# Patient Record
Sex: Female | Born: 1985 | Race: White | Hispanic: No | Marital: Married | Smoking: Never smoker
Health system: Southern US, Community
[De-identification: ages and names within clinical notes are randomized; demographics above are authoritative.]

## PROBLEM LIST (undated history)

## (undated) DIAGNOSIS — E039 Hypothyroidism, unspecified: Secondary | ICD-10-CM

## (undated) DIAGNOSIS — K8020 Calculus of gallbladder without cholecystitis without obstruction: Secondary | ICD-10-CM

## (undated) DIAGNOSIS — E282 Polycystic ovarian syndrome: Secondary | ICD-10-CM

## (undated) HISTORY — PX: WISDOM TOOTH EXTRACTION: SHX21

---
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
## 2010-03-03 ENCOUNTER — Encounter: Payer: Self-pay | Admitting: Endocrinology

## 2013-08-26 ENCOUNTER — Ambulatory Visit (HOSPITAL_COMMUNITY)
Admission: RE | Admit: 2013-08-26 | Discharge: 2013-08-26 | Disposition: A | Discharge status: Home / Self Care | Source: Ambulatory Visit | Attending: Obstetrics & Gynecology | Admitting: Obstetrics & Gynecology

## 2013-08-26 ENCOUNTER — Ambulatory Visit (HOSPITAL_COMMUNITY): Admission: RE | Admit: 2013-08-26 | Source: Ambulatory Visit

## 2013-08-26 NOTE — Lactation Note (Signed)
Lactation Consult  Gail George 593 month old  Mother's reason for visit:  Post revision of frenum Visit Type:  Outpatient Appointment Notes:  Mom and Gail George are here from [place] related to family needs.  They did not deliver here. Gail George is 4 days post revision.  Mom reports pain at the breast is much less and that Gail George is finally BF better.  SHe has only been latching her once a day related to pain but has been exclusively BF since Monday. Assisted with deeper latch and showed mom how to support her breast so that Gail George was able to keep it in her mouth.  She transferred 68 ml total. Mom said that she had just fed and hour before consult. Plan is for mom to continue working with her. I encouraged Mom to BF her on cue and to use the techniques we discussed. She will start attending support group and do weight checks. Consult:  Initial Lactation Consultant:  Soyla DryerJoseph, Alphonsus Doyel  ________________________________________________________________________    ________________________________________________________________________  Mother's Name: Gail George Type of delivery:   Breastfeeding Experience:  Has been latching once a day since birth and bottle feeding the other feedings Maternal Medical Conditions:   Hypo thyroid PCOS Maternal Medications:    ________________________________________________________________________  Breastfeeding History (Post Discharge)  Frequency of breastfeeding: Every 3 hours Duration of feeding:  20 minutes     Infant Intake and Output Assessment  Voids:  5-6 in 24 hrs.  Color:  Clear yellow Stools:  1-2 in 24 hrs.  Color:  Yellow  ________________________________________________________________________  Maternal Breast Assessment  Breast:  Soft and Compressible Nipple:  Erect Pain level:  0 Pain interventions:  NA  _______________________________________________________________________ Feeding Assessment/Evaluation  Initial feeding  assessment:  Infant's oral assessment:  Healing from revision. Tissue is WNL  Positioning:  Cross cradle and football(baby has been seen for torticollis) Right breast  LATCH documentation:  Latch:  1 = Repeated attempts needed to sustain latch, nipple held in mouth throughout feeding, stimulation needed to elicit sucking reflex.  Audible swallowing:  2 = Spontaneous and intermittent  Type of nipple:  2 = Everted at rest and after stimulation  Comfort (Breast/Nipple):  2 = Soft / non-tender  Hold (Positioning):  1 = Assistance needed to correctly position infant at breast and maintain latch  LATCH score:  8  Attached assessment:  Deep  Lips flanged:  Yes.    Lips untucked:  No.  Suck assessment:  Nutritive    Pre-feed weight:  5808 g   Post-feed weight:  5866 Amount transferred:  58 ml plus 10 from left br Amount supplemented:  0 ml

## 2013-09-19 NOTE — Addendum Note (Signed)
Encounter addended by: Soyla DryerMaryann Jarrett Chicoine, RN on: 09/19/2013  8:57 PM<BR>     Documentation filed: Clinical Notes

## 2013-09-19 NOTE — Lactation Note (Signed)
Lactation Consult  Gail George is concerned because her baby Gail George ( 4 months) has gained 3 ounces in 4 weeks.  Tonight she transferred 65 ml in support group.  Mom reports that she eats every 2-3 hours but can sleep for 7 hours overnight.  She also only eats on one breast.  I advised mom to feed her from both breasts at each feeding and to wake her every 4 hours during the night.  After feeding her on the second breast and then back to the first her total transfer was 5 ounces!.  Mom plans to come to support group tomorrow for a weight check.  I also asked her to connect with a pediatrician while she is in town for the next 2-3 months.  She will contact Dr,. Donnie CoffinRubin who sees mom's sibling.

## 2015-11-11 DIAGNOSIS — K8020 Calculus of gallbladder without cholecystitis without obstruction: Secondary | ICD-10-CM

## 2015-11-11 HISTORY — DX: Calculus of gallbladder without cholecystitis without obstruction: K80.20

## 2015-11-18 ENCOUNTER — Encounter (HOSPITAL_BASED_OUTPATIENT_CLINIC_OR_DEPARTMENT_OTHER): Payer: Self-pay | Admitting: Emergency Medicine

## 2015-11-18 ENCOUNTER — Emergency Department (HOSPITAL_BASED_OUTPATIENT_CLINIC_OR_DEPARTMENT_OTHER)

## 2015-11-18 ENCOUNTER — Emergency Department (HOSPITAL_BASED_OUTPATIENT_CLINIC_OR_DEPARTMENT_OTHER)
Admission: EM | Admit: 2015-11-18 | Discharge: 2015-11-18 | Disposition: A | Discharge status: Home / Self Care | Source: Home / Self Care | Attending: Emergency Medicine | Admitting: Emergency Medicine

## 2015-11-18 DIAGNOSIS — Z803 Family history of malignant neoplasm of breast: Secondary | ICD-10-CM | POA: Diagnosis not present

## 2015-11-18 DIAGNOSIS — K8050 Calculus of bile duct without cholangitis or cholecystitis without obstruction: Secondary | ICD-10-CM | POA: Insufficient documentation

## 2015-11-18 DIAGNOSIS — R1011 Right upper quadrant pain: Secondary | ICD-10-CM

## 2015-11-18 DIAGNOSIS — Z7984 Long term (current) use of oral hypoglycemic drugs: Secondary | ICD-10-CM | POA: Insufficient documentation

## 2015-11-18 DIAGNOSIS — K8051 Calculus of bile duct without cholangitis or cholecystitis with obstruction: Secondary | ICD-10-CM | POA: Diagnosis not present

## 2015-11-18 DIAGNOSIS — Z79899 Other long term (current) drug therapy: Secondary | ICD-10-CM | POA: Insufficient documentation

## 2015-11-18 DIAGNOSIS — Z800 Family history of malignant neoplasm of digestive organs: Secondary | ICD-10-CM | POA: Diagnosis not present

## 2015-11-18 DIAGNOSIS — E282 Polycystic ovarian syndrome: Secondary | ICD-10-CM | POA: Diagnosis not present

## 2015-11-18 DIAGNOSIS — R630 Anorexia: Secondary | ICD-10-CM | POA: Diagnosis not present

## 2015-11-18 DIAGNOSIS — E039 Hypothyroidism, unspecified: Secondary | ICD-10-CM

## 2015-11-18 DIAGNOSIS — K851 Biliary acute pancreatitis without necrosis or infection: Secondary | ICD-10-CM | POA: Diagnosis not present

## 2015-11-18 DIAGNOSIS — O9963 Diseases of the digestive system complicating the puerperium: Secondary | ICD-10-CM

## 2015-11-18 DIAGNOSIS — N390 Urinary tract infection, site not specified: Secondary | ICD-10-CM | POA: Diagnosis not present

## 2015-11-18 DIAGNOSIS — K429 Umbilical hernia without obstruction or gangrene: Secondary | ICD-10-CM | POA: Diagnosis not present

## 2015-11-18 DIAGNOSIS — K8070 Calculus of gallbladder and bile duct without cholecystitis without obstruction: Secondary | ICD-10-CM

## 2015-11-18 HISTORY — DX: Polycystic ovarian syndrome: E28.2

## 2015-11-18 HISTORY — DX: Hypothyroidism, unspecified: E03.9

## 2015-11-18 LAB — CBC WITH DIFFERENTIAL/PLATELET
Basophils Absolute: 0 10*3/uL (ref 0.0–0.1)
Basophils Relative: 0 %
EOS ABS: 0.1 10*3/uL (ref 0.0–0.7)
EOS PCT: 1 %
HCT: 38.8 % (ref 36.0–46.0)
Hemoglobin: 13 g/dL (ref 12.0–15.0)
LYMPHS ABS: 2 10*3/uL (ref 0.7–4.0)
Lymphocytes Relative: 14 %
MCH: 29.9 pg (ref 26.0–34.0)
MCHC: 33.5 g/dL (ref 30.0–36.0)
MCV: 89.2 fL (ref 78.0–100.0)
MONO ABS: 1 10*3/uL (ref 0.1–1.0)
Monocytes Relative: 7 %
Neutro Abs: 11.5 10*3/uL — ABNORMAL HIGH (ref 1.7–7.7)
Neutrophils Relative %: 78 %
PLATELETS: 269 10*3/uL (ref 150–400)
RBC: 4.35 MIL/uL (ref 3.87–5.11)
RDW: 11.9 % (ref 11.5–15.5)
WBC: 14.6 10*3/uL — AB (ref 4.0–10.5)

## 2015-11-18 LAB — LIPASE, BLOOD: LIPASE: 22 U/L (ref 11–51)

## 2015-11-18 LAB — COMPREHENSIVE METABOLIC PANEL
ALT: 153 U/L — AB (ref 14–54)
ANION GAP: 8 (ref 5–15)
AST: 190 U/L — ABNORMAL HIGH (ref 15–41)
Albumin: 4.2 g/dL (ref 3.5–5.0)
Alkaline Phosphatase: 117 U/L (ref 38–126)
BUN: 15 mg/dL (ref 6–20)
CHLORIDE: 104 mmol/L (ref 101–111)
CO2: 25 mmol/L (ref 22–32)
Calcium: 9.5 mg/dL (ref 8.9–10.3)
Creatinine, Ser: 0.58 mg/dL (ref 0.44–1.00)
GFR calc non Af Amer: >60 mL/min (ref >60)
Glucose, Bld: 93 mg/dL (ref 65–99)
POTASSIUM: 4.1 mmol/L (ref 3.5–5.1)
SODIUM: 137 mmol/L (ref 135–145)
Total Bilirubin: 1.6 mg/dL — ABNORMAL HIGH (ref 0.3–1.2)
Total Protein: 7.2 g/dL (ref 6.5–8.1)

## 2015-11-18 LAB — URINALYSIS, ROUTINE W REFLEX MICROSCOPIC
Bilirubin Urine: NEGATIVE
Glucose, UA: NEGATIVE mg/dL
Hgb urine dipstick: NEGATIVE
KETONES UR: NEGATIVE mg/dL
NITRITE: NEGATIVE
PH: 8.5 — AB (ref 5.0–8.0)
Protein, ur: 30 mg/dL — AB
SPECIFIC GRAVITY, URINE: 1.027 (ref 1.005–1.030)

## 2015-11-18 LAB — URINE MICROSCOPIC-ADD ON: RBC / HPF: NONE SEEN RBC/hpf (ref 0–5)

## 2015-11-18 MED ORDER — ONDANSETRON HCL 4 MG PO TABS: 4.0000 mg | ORAL_TABLET | Freq: Four times a day (QID) | ORAL | 0 refills | Status: DC

## 2015-11-18 MED ORDER — ONDANSETRON 4 MG PO TBDP
4.0000 mg | ORAL_TABLET | Freq: Once | ORAL | Status: AC
  Administered 2015-11-18: 4 mg via ORAL
  Filled 2015-11-18: qty 1

## 2015-11-18 MED ORDER — ONDANSETRON 4 MG PO TBDP
ORAL_TABLET | ORAL | Status: AC
  Filled 2015-11-18: qty 1

## 2015-11-18 MED ORDER — HYDROCODONE-ACETAMINOPHEN 5-325 MG PO TABS
1.0000 | ORAL_TABLET | Freq: Once | ORAL | Status: AC
  Administered 2015-11-18: 1 via ORAL
  Filled 2015-11-18: qty 1

## 2015-11-18 MED ORDER — HYDROCODONE-ACETAMINOPHEN 5-325 MG PO TABS: 1.0000 | ORAL_TABLET | Freq: Four times a day (QID) | ORAL | 0 refills | Status: DC | PRN

## 2015-11-18 MED ORDER — ONDANSETRON 4 MG PO TBDP
4.0000 mg | ORAL_TABLET | Freq: Once | ORAL | Status: AC | PRN
  Administered 2015-11-18: 4 mg via ORAL

## 2015-11-18 NOTE — ED Triage Notes (Signed)
Patient c/o RUQ abd pain that returned @ 1 week ago and is worsening. Patient reports she had a baby 6 weeks ago and had the pains at the same time then, was told it was her gallbladder. Patient reports 1 episode of emesis in past 24 hours. Patient took Motrin at home, but vomited after, states she did feel the pain easing prior to her emesis.

## 2015-11-18 NOTE — ED Provider Notes (Signed)
[facility] Provider Note   CSN: [id number] Arrival date & time: 11/18/15  1257  By signing my name below, I, Teofilo Pod, attest that this documentation has been prepared under the direction and in the presence of Gwyneth Sprout, MD . Electronically Signed: Teofilo Pod, ED Scribe. 11/18/2015. 3:20 PM.    History   Chief Complaint Chief Complaint  Patient presents with  . Abdominal Pain    RUQ   The history is provided by the patient. No language interpreter was used.   HPI Comments:  Gail George is a 30 y.o. female who presents to the Emergency Department complaining of intermittent right-sided abdominal pain x1 week. Pt states that her pain was 10/10 in severity this morning but has since improved. Pt is 6 weeks post partum. Pt states that she had an [place] after giving birth and was diagnosed with gall stones, and attributes this pain to her gall bladder. Pt complains of associated vomiting.  Pt states that the pain severity changes depending on what she eats. Pt took motrin at home with no relief, and vomited after taking the motrin. Pt denies fever, diarrhea.   Past Medical History:  Diagnosis Date  . Hypothyroidism   . PCOS (polycystic ovarian syndrome)     There are no active problems to display for this patient.   Past Surgical History:  Procedure Laterality Date  . WISDOM TOOTH EXTRACTION      OB History    No data available       Home Medications    Prior to Admission medications   Medication Sig Start Date End Date Taking? Authorizing Provider  metFORMIN (GLUMETZA) 500 MG (MOD) 24 hr tablet Take 500 mg by mouth daily with breakfast.   Yes Historical Provider, MD  thyroid (ARMOUR) 130 MG tablet Take 75 mg by mouth daily.   Yes Historical Provider, MD    Family History History reviewed. No pertinent family history.  Social History Social History  Substance Use Topics  . Smoking status: Never Smoker  . Smokeless tobacco:  Never Used  . Alcohol use No     Allergies   Review of patient's allergies indicates no known allergies.   Review of Systems Review of Systems   Physical Exam Updated Vital Signs BP 109/71 (BP Location: Left Arm)   Pulse 77   Temp 98.4 F (36.9 C) (Oral)   Resp 20   Wt 200 lb (90.7 kg)   LMP 12/25/2014 (Exact Date) Comment: Breastfeeding  SpO2 99%   Physical Exam  Constitutional: She appears well-developed and well-nourished. No distress.  HENT:  Head: Normocephalic and atraumatic.  Eyes: Conjunctivae are normal.  Cardiovascular: Normal rate.   Pulmonary/Chest: Effort normal.  Abdominal: She exhibits no distension.  Mild RUQ tenderness; no Murphy's sign  Neurological: She is alert.  Skin: Skin is warm and dry.  Psychiatric: She has a normal mood and affect.  Nursing note and vitals reviewed.    ED Treatments / Results  DIAGNOSTIC STUDIES:  Oxygen Saturation is 99% on RA, normal by my interpretation.    COORDINATION OF CARE:  3:20 PM Discussed treatment plan with pt at bedside and pt agreed to plan.   Labs (all labs ordered are listed, but only abnormal results are displayed) Labs Reviewed  URINALYSIS, ROUTINE W REFLEX MICROSCOPIC (NOT AT [facility]) - Abnormal; Notable for the following:       Result Value   Color, Urine AMBER (*)    pH 8.5 (*)  Protein, ur 30 (*)    Leukocytes, UA SMALL (*)    All other components within normal limits  URINE MICROSCOPIC-ADD ON - Abnormal; Notable for the following:    Squamous Epithelial / LPF 0-5 (*)    Bacteria, UA MANY (*)    All other components within normal limits  CBC WITH DIFFERENTIAL/PLATELET - Abnormal; Notable for the following:    WBC 14.6 (*)    Neutro Abs 11.5 (*)    All other components within normal limits  COMPREHENSIVE METABOLIC PANEL - Abnormal; Notable for the following:    AST 190 (*)    ALT 153 (*)    Total Bilirubin 1.6 (*)    All other components within normal limits  LIPASE, BLOOD     EKG  EKG Interpretation None       Radiology Koreas Abdomen Limited Ruq  Result Date: 11/18/2015 CLINICAL DATA:  Right upper quadrant pain for 1 week. EXAM: US ABDOMEN LIMITED - RIGHT UPPER QUADRANT COMPARISON:  None. FINDINGS: Gallbladder: An 8 mm non shadowing hyperechoic region is seen in the gallbladder consistent with non shadowing stones versus sludge. No wall thickening or pericholecystic fluid. No Murphy's sign. Common bile duct: Diameter: 10 mm. Shadowing in the region of the distal common bile duct measuring up to 7 mm, worrisome for a distal common bile duct stone. Liver: Intrahepatic ductal dilatation.  No focal mass. IMPRESSION: 1. Intra and extrahepatic ductal dilatation with a 7 mm hyperechoic focus in the distal common bile duct. This constellation of findings is highly concerning for choledocholithiasis. An MRCP could better evaluate. 2. Small stones or sludge in the gallbladder. No wall thickening, pericholecystic fluid, or Murphy's sign. Electronically Signed   By: Gerome Samavid  Williams III M.D   On: 11/18/2015 16:05    Procedures Procedures (including critical care time)  Medications Ordered in ED Medications  ondansetron (ZOFRAN-ODT) disintegrating tablet 4 mg (4 mg Oral Given 11/18/15 1327)     Initial Impression / Assessment and Plan / ED Course  I have reviewed the triage vital signs and the nursing notes.  Pertinent labs & imaging results that were available during my care of the patient were reviewed by me and considered in my medical decision making (see chart for details).  Clinical Course   Patient is a 30 year old female with known gallbladder disease presenting today with right upper quadrant pain which was not improving. When I went to evaluate the patient she states her pain is now resolved. She did have emesis with this. She has had waxing and waning symptoms over the last 1 week but states today was the worst. She is 6 weeks postpartum and found out she had  gallstones at [redacted] weeks pregnant. She has recently relocated to this area needs follow-up with the surgeon. On exam she has some mild soreness in her right upper quadrant but no rebound or guarding. Ultrasound today shows dilation of the common bile duct and a stone present in the common bile duct consistent with choledocholithiasis. Labs pending.  5:30 PM LFTs are elevated today, elevated T bili and normal lipase. We'll discuss with general surgery.  11:18 PM Discussed with Dr. Alvester MorinNewton and at this time pt's pain is significantly improved and not displaying sx of cholecystitis or sepsis.  He recommended f/u with surgery and GI this week for cholecystectomy but does not need emergent surgery at this time.  Pt given referrals and pain control.  Also discussed diet.  Given strict return precautions for worsening pain,  emesis or fever.  Final Clinical Impressions(s) / ED Diagnoses   Final diagnoses:  RUQ pain  Cholelithiasis with choledocholithiasis    New Prescriptions Discharge Medication List as of 11/18/2015  6:56 PM    START taking these medications   Details  HYDROcodone-acetaminophen (NORCO/VICODIN) 5-325 MG tablet Take 1-2 tablets by mouth every 6 (six) hours as needed., Starting Sun 11/18/2015, Print    ondansetron (ZOFRAN) 4 MG tablet Take 1 tablet (4 mg total) by mouth every 6 (six) hours., Starting Sun 11/18/2015, Print      I personally performed the services described in this documentation, which was scribed in my presence.  The recorded information has been reviewed and considered.     Gwyneth Sprout, MD 11/18/15 2320

## 2015-11-18 NOTE — ED Notes (Signed)
Pt in US

## 2015-11-18 NOTE — ED Notes (Signed)
Delay of care: Lab orders acknowledged by another RN, but not obtained.

## 2015-11-20 ENCOUNTER — Inpatient Hospital Stay (HOSPITAL_COMMUNITY)

## 2015-11-20 ENCOUNTER — Inpatient Hospital Stay (HOSPITAL_COMMUNITY)
Admission: EM | Admit: 2015-11-20 | Discharge: 2015-11-25 | DRG: 418 | Disposition: A | Discharge status: Home / Self Care | Attending: Internal Medicine | Admitting: Internal Medicine

## 2015-11-20 ENCOUNTER — Encounter (HOSPITAL_COMMUNITY): Payer: Self-pay

## 2015-11-20 DIAGNOSIS — E039 Hypothyroidism, unspecified: Secondary | ICD-10-CM | POA: Diagnosis present

## 2015-11-20 DIAGNOSIS — K429 Umbilical hernia without obstruction or gangrene: Secondary | ICD-10-CM | POA: Diagnosis present

## 2015-11-20 DIAGNOSIS — Z7984 Long term (current) use of oral hypoglycemic drugs: Secondary | ICD-10-CM

## 2015-11-20 DIAGNOSIS — K8051 Calculus of bile duct without cholangitis or cholecystitis with obstruction: Secondary | ICD-10-CM | POA: Diagnosis present

## 2015-11-20 DIAGNOSIS — K8309 Other cholangitis: Secondary | ICD-10-CM | POA: Diagnosis present

## 2015-11-20 DIAGNOSIS — Z800 Family history of malignant neoplasm of digestive organs: Secondary | ICD-10-CM | POA: Diagnosis not present

## 2015-11-20 DIAGNOSIS — N390 Urinary tract infection, site not specified: Secondary | ICD-10-CM | POA: Diagnosis not present

## 2015-11-20 DIAGNOSIS — B9689 Other specified bacterial agents as the cause of diseases classified elsewhere: Secondary | ICD-10-CM | POA: Diagnosis not present

## 2015-11-20 DIAGNOSIS — R739 Hyperglycemia, unspecified: Secondary | ICD-10-CM | POA: Diagnosis present

## 2015-11-20 DIAGNOSIS — K851 Biliary acute pancreatitis without necrosis or infection: Secondary | ICD-10-CM | POA: Diagnosis present

## 2015-11-20 DIAGNOSIS — K859 Acute pancreatitis without necrosis or infection, unspecified: Secondary | ICD-10-CM

## 2015-11-20 DIAGNOSIS — Z419 Encounter for procedure for purposes other than remedying health state, unspecified: Secondary | ICD-10-CM

## 2015-11-20 DIAGNOSIS — R630 Anorexia: Secondary | ICD-10-CM | POA: Diagnosis present

## 2015-11-20 DIAGNOSIS — R1011 Right upper quadrant pain: Secondary | ICD-10-CM | POA: Diagnosis present

## 2015-11-20 DIAGNOSIS — K8031 Calculus of bile duct with cholangitis, unspecified, with obstruction: Secondary | ICD-10-CM | POA: Diagnosis not present

## 2015-11-20 DIAGNOSIS — K8033 Calculus of bile duct with acute cholangitis with obstruction: Secondary | ICD-10-CM | POA: Diagnosis not present

## 2015-11-20 DIAGNOSIS — K838 Other specified diseases of biliary tract: Secondary | ICD-10-CM

## 2015-11-20 DIAGNOSIS — K8050 Calculus of bile duct without cholangitis or cholecystitis without obstruction: Secondary | ICD-10-CM

## 2015-11-20 DIAGNOSIS — Z803 Family history of malignant neoplasm of breast: Secondary | ICD-10-CM | POA: Diagnosis not present

## 2015-11-20 DIAGNOSIS — K831 Obstruction of bile duct: Secondary | ICD-10-CM

## 2015-11-20 DIAGNOSIS — R740 Nonspecific elevation of levels of transaminase and lactic acid dehydrogenase [LDH]: Secondary | ICD-10-CM

## 2015-11-20 DIAGNOSIS — R7401 Elevation of levels of liver transaminase levels: Secondary | ICD-10-CM | POA: Diagnosis present

## 2015-11-20 DIAGNOSIS — E282 Polycystic ovarian syndrome: Secondary | ICD-10-CM

## 2015-11-20 HISTORY — DX: Calculus of gallbladder without cholecystitis without obstruction: K80.20

## 2015-11-20 LAB — CBC WITH DIFFERENTIAL/PLATELET
BASOS ABS: 0 10*3/uL (ref 0.0–0.1)
BASOS PCT: 0 %
EOS PCT: 1 %
Eosinophils Absolute: 0.2 10*3/uL (ref 0.0–0.7)
HEMATOCRIT: 44.2 % (ref 36.0–46.0)
Hemoglobin: 14.4 g/dL (ref 12.0–15.0)
LYMPHS PCT: 8 %
Lymphs Abs: 1.9 10*3/uL (ref 0.7–4.0)
MCH: 29.5 pg (ref 26.0–34.0)
MCHC: 32.6 g/dL (ref 30.0–36.0)
MCV: 90.6 fL (ref 78.0–100.0)
MONO ABS: 1.2 10*3/uL — AB (ref 0.1–1.0)
Monocytes Relative: 5 %
NEUTROS ABS: 18.9 10*3/uL — AB (ref 1.7–7.7)
Neutrophils Relative %: 86 %
PLATELETS: 262 10*3/uL (ref 150–400)
RBC: 4.88 MIL/uL (ref 3.87–5.11)
RDW: 12.2 % (ref 11.5–15.5)
WBC: 22.2 10*3/uL — AB (ref 4.0–10.5)

## 2015-11-20 LAB — COMPREHENSIVE METABOLIC PANEL
ALK PHOS: 246 U/L — AB (ref 38–126)
ALT: 650 U/L — ABNORMAL HIGH (ref 14–54)
ANION GAP: 10 (ref 5–15)
AST: 352 U/L — ABNORMAL HIGH (ref 15–41)
Albumin: 3.9 g/dL (ref 3.5–5.0)
BILIRUBIN TOTAL: 5.8 mg/dL — AB (ref 0.3–1.2)
BUN: 8 mg/dL (ref 6–20)
CALCIUM: 9.5 mg/dL (ref 8.9–10.3)
CO2: 21 mmol/L — ABNORMAL LOW (ref 22–32)
Chloride: 108 mmol/L (ref 101–111)
Creatinine, Ser: 0.74 mg/dL (ref 0.44–1.00)
GFR calc Af Amer: >60 mL/min (ref >60)
GLUCOSE: 133 mg/dL — AB (ref 65–99)
POTASSIUM: 4.1 mmol/L (ref 3.5–5.1)
Sodium: 139 mmol/L (ref 135–145)
TOTAL PROTEIN: 7.1 g/dL (ref 6.5–8.1)

## 2015-11-20 LAB — URINALYSIS, ROUTINE W REFLEX MICROSCOPIC
GLUCOSE, UA: NEGATIVE mg/dL
KETONES UR: 15 mg/dL — AB
Nitrite: NEGATIVE
PROTEIN: NEGATIVE mg/dL
Specific Gravity, Urine: 1.011 (ref 1.005–1.030)
pH: 6 (ref 5.0–8.0)

## 2015-11-20 LAB — LIPID PANEL
CHOL/HDL RATIO: 1.8 ratio
Cholesterol: 188 mg/dL (ref 0–200)
HDL: 105 mg/dL (ref >40)
LDL Cholesterol: 76 mg/dL (ref 0–99)
Triglycerides: 36 mg/dL (ref <150)
VLDL: 7 mg/dL (ref 0–40)

## 2015-11-20 LAB — URINE MICROSCOPIC-ADD ON

## 2015-11-20 LAB — PROTEIN / CREATININE RATIO, URINE
Creatinine, Urine: 46.07 mg/dL
Protein Creatinine Ratio: 0.22 mg/mg{Cre} — ABNORMAL HIGH (ref 0.00–0.15)
Total Protein, Urine: 10 mg/dL

## 2015-11-20 LAB — LIPASE, BLOOD: Lipase: 3802 U/L — ABNORMAL HIGH (ref 11–51)

## 2015-11-20 LAB — I-STAT CG4 LACTIC ACID, ED: LACTIC ACID, VENOUS: 0.7 mmol/L (ref 0.5–1.9)

## 2015-11-20 MED ORDER — SODIUM CHLORIDE 0.9 % IV BOLUS (SEPSIS)
1000.0000 mL | Freq: Once | INTRAVENOUS | Status: AC
  Administered 2015-11-20: 1000 mL via INTRAVENOUS

## 2015-11-20 MED ORDER — HYDROMORPHONE HCL 1 MG/ML IJ SOLN
1.0000 mg | INTRAMUSCULAR | Status: AC
  Administered 2015-11-20: 1 mg via INTRAVENOUS
  Filled 2015-11-20: qty 1

## 2015-11-20 MED ORDER — HYDROMORPHONE HCL 1 MG/ML IJ SOLN
1.0000 mg | INTRAMUSCULAR | Status: DC | PRN
  Administered 2015-11-20: 1 mg via INTRAVENOUS
  Filled 2015-11-20: qty 1

## 2015-11-20 MED ORDER — DEXTROSE IN LACTATED RINGERS 5 % IV SOLN
INTRAVENOUS | Status: DC
  Administered 2015-11-20: 200 mL/h via INTRAVENOUS
  Administered 2015-11-21 – 2015-11-25 (×4): via INTRAVENOUS

## 2015-11-20 MED ORDER — SODIUM CHLORIDE 0.9 % IV SOLN: INTRAVENOUS | Status: DC

## 2015-11-20 MED ORDER — ONDANSETRON HCL 4 MG/2ML IJ SOLN
4.0000 mg | Freq: Four times a day (QID) | INTRAMUSCULAR | Status: DC | PRN
  Administered 2015-11-21 – 2015-11-23 (×3): 4 mg via INTRAVENOUS
  Filled 2015-11-20 (×3): qty 2

## 2015-11-20 MED ORDER — PIPERACILLIN-TAZOBACTAM 3.375 G IVPB 30 MIN
3.3750 g | Freq: Once | INTRAVENOUS | Status: AC
  Administered 2015-11-20: 3.375 g via INTRAVENOUS
  Filled 2015-11-20: qty 50

## 2015-11-20 MED ORDER — ONDANSETRON HCL 4 MG/2ML IJ SOLN
4.0000 mg | Freq: Once | INTRAMUSCULAR | Status: AC
  Administered 2015-11-20: 4 mg via INTRAVENOUS
  Filled 2015-11-20: qty 2

## 2015-11-20 MED ORDER — HYDROMORPHONE HCL 1 MG/ML IJ SOLN
0.5000 mg | Freq: Once | INTRAMUSCULAR | Status: AC
  Administered 2015-11-20: 0.5 mg via INTRAVENOUS
  Filled 2015-11-20: qty 1

## 2015-11-20 MED ORDER — PIPERACILLIN-TAZOBACTAM 3.375 G IVPB
3.3750 g | Freq: Three times a day (TID) | INTRAVENOUS | Status: DC
  Administered 2015-11-20 – 2015-11-23 (×8): 3.375 g via INTRAVENOUS
  Filled 2015-11-20 (×10): qty 50

## 2015-11-20 MED ORDER — HYDROMORPHONE HCL 1 MG/ML IJ SOLN
0.5000 mg | Freq: Once | INTRAMUSCULAR | Status: AC
Start: 2015-11-20 — End: 2015-11-20
  Administered 2015-11-20: 0.5 mg via INTRAVENOUS
  Filled 2015-11-20: qty 1

## 2015-11-20 MED ORDER — HYDROMORPHONE HCL 1 MG/ML IJ SOLN
1.0000 mg | INTRAMUSCULAR | Status: DC | PRN
  Administered 2015-11-20 – 2015-11-24 (×19): 1 mg via INTRAVENOUS
  Filled 2015-11-20 (×19): qty 1

## 2015-11-20 MED ORDER — FAMOTIDINE 10 MG PO TABS
10.0000 mg | ORAL_TABLET | Freq: Once | ORAL | Status: AC
  Administered 2015-11-20: 10 mg via ORAL
  Filled 2015-11-20: qty 1

## 2015-11-20 MED ORDER — LACTATED RINGERS IV SOLN
INTRAVENOUS | Status: DC
  Administered 2015-11-20: 200 mL/h via INTRAVENOUS

## 2015-11-20 MED ORDER — PROMETHAZINE HCL 25 MG/ML IJ SOLN
12.5000 mg | Freq: Four times a day (QID) | INTRAMUSCULAR | Status: DC | PRN
  Administered 2015-11-20 – 2015-11-23 (×7): 12.5 mg via INTRAVENOUS
  Filled 2015-11-20 (×8): qty 1

## 2015-11-20 MED ORDER — MORPHINE SULFATE (PF) 4 MG/ML IV SOLN
4.0000 mg | Freq: Once | INTRAVENOUS | Status: AC
  Administered 2015-11-20: 4 mg via INTRAVENOUS
  Filled 2015-11-20: qty 1

## 2015-11-20 MED ORDER — HYDROMORPHONE HCL 1 MG/ML IJ SOLN
INTRAMUSCULAR | Status: AC
  Filled 2015-11-20: qty 1

## 2015-11-20 NOTE — ED Notes (Signed)
Pt here for R sided abd pain with pain10/10. She has a history of gallstones and is expected to have gallbladder removed soon. Pt. Is also 6 weeks post partum. She came in today with nausea, vomiting, and extreme pain. She took a prescribed vicodin this Am before EMS, and received Zofran in the truck. She has had 4 zofran, 4 morphine, and 1 of dilaudid total with us down here. Pt. Has received 1000ml bolus and first dose of IV Zosyn down here. Admitting MD still wants the blood cultures even though she has had her first dose of zosyn. Patient has a 20g in her L forearm, almost AC and husband at bedside.

## 2015-11-20 NOTE — Care Management Note (Signed)
Case Management Note  Patient Details  Name: Gail George MRN: [id number] Date of Birth: 1985-10-04  Subjective/Objective:    Pt in with Cholelithiasis with obstruction. She is from home with her spouse.                Action/Plan: Plan is to return home when patient medially stable. CM following for d/c needs.   Expected Discharge Date:                  Expected Discharge Plan:  Home/Self Care  In-House Referral:     Discharge planning Services     Post Acute Care Choice:    Choice offered to:     DME Arranged:    DME Agency:     HH Arranged:    HH Agency:     Status of Service:  In process, will continue to follow  If discussed at Long Length of Stay Meetings, dates discussed:    Additional Comments:  Kermit BaloKelli F Renny Remer, RN 11/20/2015, 3:04 PM

## 2015-11-20 NOTE — ED Provider Notes (Signed)
[facility] DEPT Provider Note   CSN: [id number] Arrival date & time: 11/20/15  9381     History   Chief Complaint Chief Complaint  Patient presents with  . Abdominal Pain    HPI Gail George is a 30 y.o. female.  HPI   Patient's very pleasant 30 year old female in acute distress on arrival. She reports that she had right upper quadrant pain that started getting worse this morning. Last night she had feelings of chills and fever with some nausea. This morning she started with intense right upper quadrant pain. She's been having right upper quadrant pain throughout pregnancy. (she is 6 weeks post partum)  She was seen at [facility] a couple days ago for such and found to have dilated ducts with no evidence of stone, thought to be choledocholithiasis. At that time his consult with Dr. Ernestina Patches because she was comfortable she returned home for outpatient scheduling of surgery.  Past Medical History:  Diagnosis Date  . Hypothyroidism   . PCOS (polycystic ovarian syndrome)     Patient Active Problem List   Diagnosis Date Noted  . Choledocholithiasis with obstruction 11/20/2015  . Hypothyroidism 11/20/2015  . PCOS (polycystic ovarian syndrome) 11/20/2015  . Pancreatitis due to biliary obstruction 11/20/2015  . Hyperglycemia 11/20/2015  . Transaminitis 11/20/2015  . Cholangitis 11/20/2015  . Hyperbilirubinemia 11/20/2015    Past Surgical History:  Procedure Laterality Date  . WISDOM TOOTH EXTRACTION      OB History    No data available       Home Medications    Prior to Admission medications   Medication Sig Start Date End Date Taking? Authorizing Provider  HYDROcodone-acetaminophen (NORCO/VICODIN) 5-325 MG tablet Take 1-2 tablets by mouth every 6 (six) hours as needed. 11/18/15  Yes Blanchie Dessert, MD  metFORMIN (GLUMETZA) 500 MG (MOD) 24 hr tablet Take 500 mg by mouth every evening.    Yes Historical Provider, MD  ondansetron (ZOFRAN) 4 MG tablet Take 1  tablet (4 mg total) by mouth every 6 (six) hours. 11/18/15  Yes Blanchie Dessert, MD  Prenatal Vit-Fe Fumarate-FA (PRENATAL MULTIVITAMIN) TABS tablet Take 1 tablet by mouth daily at 12 noon.   Yes Historical Provider, MD  thyroid (ARMOUR) 15 MG tablet Take 15 mg by mouth daily. Pt takes with 60 mg to equal 75 mg   Yes Historical Provider, MD  thyroid (ARMOUR) 60 MG tablet Take 60 mg by mouth daily before breakfast. Pt takes with 15 mg to equal 75 mg   Yes Historical Provider, MD  thyroid (ARMOUR) 130 MG tablet Take 75 mg by mouth daily.    Historical Provider, MD    Family History No family history on file.  Social History Social History  Substance Use Topics  . Smoking status: Never Smoker  . Smokeless tobacco: Never Used  . Alcohol use No     Allergies   Review of patient's allergies indicates no known allergies.   Review of Systems Review of Systems  Constitutional: Positive for fatigue.  HENT: Negative for congestion.   Gastrointestinal: Positive for abdominal pain, nausea and vomiting.  Psychiatric/Behavioral: Negative for agitation.     Physical Exam Updated Vital Signs BP 120/81 (BP Location: Right Arm)   Pulse 64   Temp 98.4 F (36.9 C) (Oral)   Resp 16   LMP 12/25/2014 (Exact Date) Comment: Breastfeeding  SpO2 96%   Physical Exam  Constitutional: She is oriented to person, place, and time. She appears well-developed and well-nourished.  HENT:  Head: Normocephalic and atraumatic.  Mildly jaundiced.  Eyes: Right eye exhibits no discharge.  Cardiovascular:  Tachycardia.  Pulmonary/Chest: Effort normal.  Abdominal: There is tenderness.  Rebound and guarding extreme tenderness to the right upper quadrant.  Neurological: She is oriented to person, place, and time.  Skin: Skin is warm and dry. She is not diaphoretic.  Psychiatric: She has a normal mood and affect.  Nursing note and vitals reviewed.    ED Treatments / Results  Labs (all labs ordered are  listed, but only abnormal results are displayed) Labs Reviewed  COMPREHENSIVE METABOLIC PANEL - Abnormal; Notable for the following:       Result Value   CO2 21 (*)    Glucose, Bld 133 (*)    AST 352 (*)    ALT 650 (*)    Alkaline Phosphatase 246 (*)    Total Bilirubin 5.8 (*)    All other components within normal limits  CBC WITH DIFFERENTIAL/PLATELET - Abnormal; Notable for the following:    WBC 22.2 (*)    Neutro Abs 18.9 (*)    Monocytes Absolute 1.2 (*)    All other components within normal limits  LIPASE, BLOOD - Abnormal; Notable for the following:    Lipase 3,802 (*)    All other components within normal limits  URINALYSIS, ROUTINE W REFLEX MICROSCOPIC (NOT AT [facility]) - Abnormal; Notable for the following:    Color, Urine AMBER (*)    APPearance CLOUDY (*)    Hgb urine dipstick LARGE (*)    Bilirubin Urine SMALL (*)    Ketones, ur 15 (*)    Leukocytes, UA MODERATE (*)    All other components within normal limits  PROTEIN / CREATININE RATIO, URINE - Abnormal; Notable for the following:    Protein Creatinine Ratio 0.22 (*)    All other components within normal limits  URINE MICROSCOPIC-ADD ON - Abnormal; Notable for the following:    Squamous Epithelial / LPF 6-30 (*)    Bacteria, UA FEW (*)    All other components within normal limits  CULTURE, BLOOD (ROUTINE X 2)  CULTURE, BLOOD (ROUTINE X 2)  URINE CULTURE  LIPID PANEL  I-STAT CG4 LACTIC ACID, ED    EKG  EKG Interpretation  Date/Time:  Tuesday November 20 2015 09:35:02 EDT Ventricular Rate:  70 PR Interval:    QRS Duration: 76 QT Interval:  401 QTC Calculation: 433 R Axis:   68 Text Interpretation:  Age not entered, assumed to be  30 years old for purpose of ECG interpretation Sinus rhythm Normal sinus rhythm Confirmed by Gerald Leitz ([id number]) on 11/20/2015 10:07:35 AM       Radiology US Abdomen Limited  Result Date: 11/20/2015 CLINICAL DATA:  Choledocholithiasis.  Six weeks postpartum. EXAM: US  ABDOMEN LIMITED - RIGHT UPPER QUADRANT COMPARISON:  11/18/2015 FINDINGS: Gallbladder: Mild to moderate distention of the gallbladder without gallbladder without wall thickening. Reportedly, the patient does not have a sonographic Murphy's sign. There is at least 1 small echogenic structure in the gallbladder measuring 0.5 cm. Common bile duct: Diameter: Measures up to 1.0 cm and stable. A biliary stone is not confidently identified but the distal common bile duct is difficult to evaluate. Liver: No focal lesion identified. Normal echogenicity in the liver. There is mild to moderate intrahepatic biliary dilatation. IMPRESSION: Intrahepatic and extrahepatic biliary dilatation. Common bile duct is stable in size from the previous examination, measuring up to 1.0 cm. Stone is not confidently identified on this examination but  limited evaluation of the distal common bile duct. Findings remain concerning for a distal common bile duct stone. This could be better characterized with MRCP. Mild to moderate distention of the gallbladder without wall thickening. There is at least one small gallstone. Electronically Signed   By: Markus Daft M.D.   On: 11/20/2015 16:09    Procedures Procedures (including critical care time)  Medications Ordered in ED Medications  piperacillin-tazobactam (ZOSYN) IVPB 3.375 g (not administered)  ondansetron (ZOFRAN) injection 4 mg (not administered)  HYDROmorphone (DILAUDID) injection 1 mg (not administered)  lactated ringers infusion (200 mL/hr Intravenous New Bag/Given 11/20/15 1416)  morphine 4 MG/ML injection 4 mg (4 mg Intravenous Given 11/20/15 0926)  ondansetron (ZOFRAN) injection 4 mg (4 mg Intravenous Given 11/20/15 0926)  sodium chloride 0.9 % bolus 1,000 mL (0 mLs Intravenous Stopped 11/20/15 1050)  HYDROmorphone (DILAUDID) injection 0.5 mg (0.5 mg Intravenous Given 11/20/15 1027)  piperacillin-tazobactam (ZOSYN) IVPB 3.375 g (0 g Intravenous Stopped 11/20/15 1120)    HYDROmorphone (DILAUDID) injection 0.5 mg (0.5 mg Intravenous Given 11/20/15 1147)  sodium chloride 0.9 % bolus 1,000 mL (1,000 mLs Intravenous Given 11/20/15 1306)  HYDROmorphone (DILAUDID) injection 1 mg (1 mg Intravenous Given 11/20/15 1359)     Initial Impression / Assessment and Plan / ED Course  I have reviewed the triage vital signs and the nursing notes.  Pertinent labs & imaging results that were available during my care of the patient were reviewed by me and considered in my medical decision making (see chart for details).  Clinical Course  Comment By Time  Still awaiting labs  Mccayla Shimada Lyn Jacobo Moncrief, MD 10/10 63   Patient is a very pleasant 30 year old female with known choledocholithiasis who had worsening pain and jaundice. Patient presents today in extreme pain in her right upper quadrant. She's had nausea vomiting diarrhea. I think this is likely worsening of her choledocholithiasis- concern for ascending cholangitis. We'll treat pain and nausea. We will get labs and immediate consultation surgery.  Have considered help syndrome in this patient given that she is postpartum. However we are awaiting urine for protein but her blood pressure has not met criteria.  4:17 PM Talked to GI.  They will come see her. Talked to surgery as well, they will follow. Ultimately she needs ERCP. Zosyn and fluids given.  Will admit to medicine.   Final Clinical Impressions(s) / ED Diagnoses   Final diagnoses:  Choledocholithiasis  Pancreatitis due to biliary obstruction    New Prescriptions Current Discharge Medication List       Cathren Sween Julio Alm, MD 11/20/15 1617

## 2015-11-20 NOTE — Progress Notes (Signed)
recvd phone call from pt's mother who works at [employer] alerting me that her daughter was in ED with GB problems In reviewing medical record, it appears pt has CBD stone given u/s finding and now elevated LFTs. With reported fevers and RUQ this also concerning for possible cholangitis.   Needs IV fluid Needs IV abx - zosyn GI consult for ERCP Will see for cholecystectomy but duct needs to be addressed first.  Discussed with EDP Consult to follow  Mary SellaEric M. Andrey CampanileWilson, MD, FACS General, Bariatric, & Minimally Invasive Surgery [facility] [place]

## 2015-11-20 NOTE — Consult Note (Signed)
[facility] Consult: 12:00 PM 11/20/2015  LOS: 0 days    Referring Provider: Dr Nelson Chimes in ED.   Primary Care Physician:  No PCP Per Patient Primary Gastroenterologist:  Dr Myrtie Cruise.          GI Attending   I have taken an interval history, reviewed the chart and examined the patient. I agree with the Advanced Practitioner's note, impression and recommendations. See bold for my notes.  Iva Boop, MD, [facility] [phone] (pager) [phone] after 5 PM, weekends and holidays  11/20/2015 5:15 PM      IMPRESSION:   *  Probable choledocholithiasis. Question cholangitis?  Gallbladder stones and sludge.  Had issues with gallstones during her pregnancy.  *  Elevated lipase. Likely biliary pancreatitis.  *  Status post vaginal delivery on 10/06/15.  US shows 10 mm CBD still - distal duct not seen well. Clinical scenario suggests she has CBD stone and acute pancreatitis.  PLAN:     *  Case D/w Dr. Leone Payor.  We will repeat the ultrasound to reassess the common bile duct. It may allow some cueing of the pancreas as well. Keep her nothing by mouth and continue antibiotics along with analgesics and antibiotics.    *  Bolus of normal saline has been ordered by Dr. Earlene Plater. After this is completed we will begin lactated Ringer's at 200 ML's per hour.   D5LR fluids (glucose added as NPO) ERCP tomorrow The risks and benefits as well as alternatives of endoscopic procedure(s) have been discussed and reviewed. All questions answered. The patient agrees to proceed. Make Dilaudid q 2 and add promethazine     Jennye Moccasin  11/20/2015, 12:00 PM Pager: [phone]        Reason for Consultation:  Abdominal pain and    HPI: Gail George is a 30 y.o. female.  Hx  polycystic ovary disease.  Hypothyroidism. 6 weeks PP, vaginal delivery of her 2nd child on 8/26.  This was at hospital in [facility]   Pt had symptomatic gallstones during pregnancy weeks 20 - 32.  Too far along for surgery.  Pain eventually subsided.  Husband says she did not require antibiotics.   She and her spouse are now living with his parents in [place].    ~ 7 to 10 days ago began having recurrent right abdominal pain and nausea, bilious emesis.  Went to [facility] ED. LFTs elevated. WBCs 14 K.  11/17/15 ultrasound: 10mm CBD.  7 mm hyperechoic focus in the distal common bile duct. Findings  "highly concerning for choledocholithiasis" " MRCP could better evaluate".  Small stones and sludge in GB.   sxs subsided during ED stay.  Case d/w Dr Marland KitchenAlvester Morin" (? Ezzard Standing) who rec'd surgical and GI follow up ([facility] and [facility] phone #s provided).  Discharged with Rx of Zofran and Vicodin.  Has not made apt with GI.   Sxs got way worse in last 24 hours, severe right abdominal pain.  Chills, subjective fever. Bilious n/v, dark amber urine.  Came back to ED.  LFTs further elevted along with new elevation of Lipase.  WBCs to 22 K.  No additional imaging.  Pain briefly relieved with Dilaudid. Dosed with Zosyn.    Ref. Range 11/18/2015 16:50 11/20/2015 09:29  Lipase Latest Ref Range: 11 - 51 U/L 22 3,802 (H)  AST Latest Ref Range: 15 - 41 U/L 190 (H) 352 (H)  ALT Latest Ref Range: 14 - 54 U/L 153 (H) 650 (H)  Total Protein Latest Ref Range: 6.5 - 8.1 g/dL 7.2 7.1  Total Bilirubin Latest Ref Range: 0.3 - 1.2 mg/dL 1.6 (H) 5.8 (H)   Past Medical History:  Diagnosis Date  . Hypothyroidism   . PCOS (polycystic ovarian syndrome)     Past Surgical History:  Procedure Laterality Date  . WISDOM TOOTH EXTRACTION      Prior to Admission medications   Medication Sig Start Date End Date Taking? Authorizing Provider  HYDROcodone-acetaminophen (NORCO/VICODIN) 5-325 MG tablet Take 1-2 tablets by mouth every 6 (six)  hours as needed. 11/18/15  Yes Gwyneth Sprout, MD  metFORMIN (GLUMETZA) 500 MG (MOD) 24 hr tablet Take 500 mg by mouth every evening.    Yes Historical Provider, MD  ondansetron (ZOFRAN) 4 MG tablet Take 1 tablet (4 mg total) by mouth every 6 (six) hours. 11/18/15  Yes Gwyneth Sprout, MD  Prenatal Vit-Fe Fumarate-FA (PRENATAL MULTIVITAMIN) TABS tablet Take 1 tablet by mouth daily at 12 noon.   Yes Historical Provider, MD  thyroid (ARMOUR) 15 MG tablet Take 15 mg by mouth daily. Pt takes with 60 mg to equal 75 mg   Yes Historical Provider, MD  thyroid (ARMOUR) 60 MG tablet Take 60 mg by mouth daily before breakfast. Pt takes with 15 mg to equal 75 mg   Yes Historical Provider, MD  thyroid (ARMOUR) 130 MG tablet Take 75 mg by mouth daily.    Historical Provider, MD    Scheduled Meds:   Infusions: . piperacillin-tazobactam (ZOSYN)  IV     PRN Meds:    Allergies as of 11/20/2015  . (No Known Allergies)    No family history on file.  Social History   Social History  . Marital status: Married    Spouse name: N/A  . Number of children: N/A  . Years of education: N/A   Occupational History  . Not on file.   Social History Main Topics  . Smoking status: Never Smoker  . Smokeless tobacco: Never Used  . Alcohol use No  . Drug use: No  . Sexual activity: Yes    Birth control/ protection: None   Other Topics Concern  . Not on file   Social History Narrative  . No narrative on file    REVIEW OF SYSTEMS: Constitutional:  Malaise, anorexia ENT:  No nose bleeds Pulm:  Pain in abdomen worse with deeper breathing.  Not dyspneic.  No cough CV:  No palpitations, no LE edema.  GU:  No hematuria, no frequency.  Although the urine is amber colored, she has been urinating well according to her husband. GI:  Per HPI Heme:  No unusual  Bleeding or bruising   Neuro:  No headaches, no peripheral tingling or numbness Derm:  No itching, no rash or sores.  Endocrine:  No sweats or  chills.  No polyuria or dysuria Immunization:  Not queried Travel:  None beyond local counties in last few months.    PHYSICAL EXAM: Vital signs in last 24 hours: Vitals:   11/20/15 1015 11/20/15 1030  BP: 129/90  132/68  Pulse: 70 75  Resp: 10 11   Wt Readings from Last 3 Encounters:  11/18/15 90.7 kg (200 lb)    General: WF who is in pain and uncomfortable Head:  No facial asymmetry or swelling.  Eyes:  Scleral icterus. Ears:  Not hard of hearing  Nose:  No discharge or congestion Mouth:  Moist, clear oral mucosa. Good dentition. Tongue is midline. Neck:  No JVD, no masses. No thyromegaly. Lungs:  Clear to auscultation and percussion bilaterally but diminished sounds overall due to poor inspiration. No increased work of breathing. Heart: RRR. No MRG. S1/S2 present. Abdomen:  Nondistended. Soft. Bowel sounds absent. Tenderness on the right side and epigastrium, worst in the right upper quadrant. No guarding or rebound..   Rectal: Deferred   Musc/Skeltl: No joint erythema or swelling. Extremities:  No CCE.  Neurologic:  She is alert. She is oriented 3. She moves all 4 limbs. No tremors. Skin:  No obvious jaundice. No sores or rashes.   Psych:  Somewhat anxious and agitated due to high pain level.  Intake/Output from previous day: No intake/output data recorded. Intake/Output this shift: No intake/output data recorded.  LAB RESULTS:  Recent Labs  11/18/15 1650 11/20/15 0929  WBC 14.6* 22.2*  HGB 13.0 14.4  HCT 38.8 44.2  PLT 269 262   BMET Lab Results  Component Value Date   NA 139 11/20/2015   NA 137 11/18/2015   K 4.1 11/20/2015   K 4.1 11/18/2015   CL 108 11/20/2015   CL 104 11/18/2015   CO2 21 (L) 11/20/2015   CO2 25 11/18/2015   GLUCOSE 133 (H) 11/20/2015   GLUCOSE 93 11/18/2015   BUN 8 11/20/2015   BUN 15 11/18/2015   CREATININE 0.74 11/20/2015   CREATININE 0.58 11/18/2015   CALCIUM 9.5 11/20/2015   CALCIUM 9.5 11/18/2015   LFT  Recent  Labs  11/18/15 1650 11/20/15 0929  PROT 7.2 7.1  ALBUMIN 4.2 3.9  AST 190* 352*  ALT 153* 650*  ALKPHOS 117 246*  BILITOT 1.6* 5.8*   PT/INR No results found for: INR, PROTIME Hepatitis Panel No results for input(s): HEPBSAG, HCVAB, HEPAIGM, HEPBIGM in the last 72 hours. C-Diff No components found for: CDIFF Lipase     Component Value Date/Time   LIPASE 3,802 (H) 11/20/2015 0929    RADIOLOGY STUDIES: [place]as Abdomen Limited Ruq  Result Date: 11/18/2015 CLINICAL DATA:  Right upper quadrant pain for 1 week. EXAM: US ABDOMEN LIMITED - RIGHT UPPER QUADRANT COMPARISON:  None. FINDINGS: Gallbladder: An 8 mm non shadowing hyperechoic region is seen in the gallbladder consistent with non shadowing stones versus sludge. No wall thickening or pericholecystic fluid. No Murphy's sign. Common bile duct: Diameter: 10 mm. Shadowing in the region of the distal common bile duct measuring up to 7 mm, worrisome for a distal common bile duct stone. Liver: Intrahepatic ductal dilatation.  No focal mass. IMPRESSION: 1. Intra and extrahepatic ductal dilatation with a 7 mm hyperechoic focus in the distal common bile duct. This constellation of findings is highly concerning for choledocholithiasis. An MRCP could better evaluate. 2. Small stones or sludge in the gallbladder. No wall thickening, pericholecystic fluid, or Murphy's sign. Electronically Signed   By: Gerome Samavid  Williams III M.D   On: 11/18/2015 16:05    ENDOSCOPIC STUDIES: None ever

## 2015-11-20 NOTE — Consult Note (Signed)
[facility] Consult Note  Gail George 1985-10-03  [id number].    Requesting MD: Dr. Thomasene Lot Chief Complaint/Reason for Consult: symptomatic cholelithiasis HPI:  30 y/o female 6 mos post-partum with a PMH of PCOS, gallstones, and hypothyroidism who presents to [facility] with severe RUQ pain. Patient previously seen at [facility] 10/8 where RUQ suggested choledocholithiasis with common bile duct dilatation and she was discharged with instructions for outpatient GI and surgical follow up. Today, her abdominal pain became more severe. Associated symptoms include subjective fever, chills, nausea, and bilious vomiting. Her last episode of vomiting was today. Her last meal was yesterday afternoon. She denies blood thinning medications or previous abdominal surgeries. General surgery was asked to consult regarding possible cholecystectomy.  WBC 22 Hepatic Function Latest Ref Rng & Units 11/20/2015 11/18/2015  Total Protein 6.5 - 8.1 g/dL 7.1 7.2  Albumin 3.5 - 5.0 g/dL 3.9 4.2  AST 15 - 41 U/L 352(H) 190(H)  ALT 14 - 54 U/L 650(H) 153(H)  Alk Phosphatase 38 - 126 U/L 246(H) 117  Total Bilirubin 0.3 - 1.2 mg/dL 5.8(H) 1.6(H)    ROS: All systems reviewed and otherwise negative except for as above  No family history on file.  Past Medical History:  Diagnosis Date  . Hypothyroidism   . PCOS (polycystic ovarian syndrome)     Past Surgical History:  Procedure Laterality Date  . WISDOM TOOTH EXTRACTION      Social History:  reports that she has never smoked. She has never used smokeless tobacco. She reports that she does not drink alcohol or use drugs.  Allergies: No Known Allergies  Medications Prior to Admission  Medication Sig Dispense Refill  . HYDROcodone-acetaminophen (NORCO/VICODIN) 5-325 MG tablet Take 1-2 tablets by mouth every 6 (six) hours as needed. 15 tablet 0  . metFORMIN (GLUMETZA) 500 MG (MOD) 24 hr tablet Take 500 mg by mouth every evening.     . ondansetron  (ZOFRAN) 4 MG tablet Take 1 tablet (4 mg total) by mouth every 6 (six) hours. 12 tablet 0  . Prenatal Vit-Fe Fumarate-FA (PRENATAL MULTIVITAMIN) TABS tablet Take 1 tablet by mouth daily at 12 noon.    . thyroid (ARMOUR) 15 MG tablet Take 15 mg by mouth daily. Pt takes with 60 mg to equal 75 mg    . thyroid (ARMOUR) 60 MG tablet Take 60 mg by mouth daily before breakfast. Pt takes with 15 mg to equal 75 mg    . thyroid (ARMOUR) 130 MG tablet Take 75 mg by mouth daily.      Blood pressure 128/85, pulse 76, temperature 98.4 F (36.9 C), temperature source Oral, resp. rate 16, last menstrual period 12/25/2014, SpO2 93 %. Physical Exam: General: ill-appearing, cooperative, overweight female laying in bed in obvious distress.  HEENT: head is normocephalic, atraumatic. Heart: regular, rate, and rhythm.  No obvious murmurs, gallops, or rubs noted.  Palpable pedal pulses bilaterally Lungs: CTAB, no wheezes, rhonchi, or rales noted.  Respiratory effort nonlabored Abd: soft, tender to light palpation of RUQ and RLQ without peritonitis, hypoactive bowel sounds MS: all 4 extremities are symmetrical with no cyanosis, clubbing, or edema. Skin: warm and dry. + jaundice Psych: appropriate affect. Neuro: A&Ox3, moves all extremities spontaneously, normal speech  Results for orders placed or performed during the hospital encounter of 11/20/15 (from the past 48 hour(s))  Comprehensive metabolic panel     Status: Abnormal   Collection Time: 11/20/15  9:29 AM  Result Value Ref Range   Sodium  139 135 - 145 mmol/L   Potassium 4.1 3.5 - 5.1 mmol/L   Chloride 108 101 - 111 mmol/L   CO2 21 (L) 22 - 32 mmol/L   Glucose, Bld 133 (H) 65 - 99 mg/dL   BUN 8 6 - 20 mg/dL   Creatinine, Ser 0.74 0.44 - 1.00 mg/dL   Calcium 9.5 8.9 - 10.3 mg/dL   Total Protein 7.1 6.5 - 8.1 g/dL   Albumin 3.9 3.5 - 5.0 g/dL   AST 352 (H) 15 - 41 U/L   ALT 650 (H) 14 - 54 U/L   Alkaline Phosphatase 246 (H) 38 - 126 U/L   Total  Bilirubin 5.8 (H) 0.3 - 1.2 mg/dL   GFR calc non Af Amer >60 >60 mL/min   GFR calc Af Amer >60 >60 mL/min    Comment: (NOTE) The eGFR has been calculated using the CKD EPI equation. This calculation has not been validated in all clinical situations. eGFR's persistently <60 mL/min signify possible Chronic Kidney Disease.    Anion gap 10 5 - 15  CBC with Differential/Platelet     Status: Abnormal   Collection Time: 11/20/15  9:29 AM  Result Value Ref Range   WBC 22.2 (H) 4.0 - 10.5 K/uL   RBC 4.88 3.87 - 5.11 MIL/uL   Hemoglobin 14.4 12.0 - 15.0 g/dL   HCT 44.2 36.0 - 46.0 %   MCV 90.6 78.0 - 100.0 fL   MCH 29.5 26.0 - 34.0 pg   MCHC 32.6 30.0 - 36.0 g/dL   RDW 12.2 11.5 - 15.5 %   Platelets 262 150 - 400 K/uL   Neutrophils Relative % 86 %   Neutro Abs 18.9 (H) 1.7 - 7.7 K/uL   Lymphocytes Relative 8 %   Lymphs Abs 1.9 0.7 - 4.0 K/uL   Monocytes Relative 5 %   Monocytes Absolute 1.2 (H) 0.1 - 1.0 K/uL   Eosinophils Relative 1 %   Eosinophils Absolute 0.2 0.0 - 0.7 K/uL   Basophils Relative 0 %   Basophils Absolute 0.0 0.0 - 0.1 K/uL  Lipase, blood     Status: Abnormal   Collection Time: 11/20/15  9:29 AM  Result Value Ref Range   Lipase 3,802 (H) 11 - 51 U/L    Comment: RESULTS CONFIRMED BY MANUAL DILUTION  I-Stat CG4 Lactic Acid, ED     Status: None   Collection Time: 11/20/15  9:35 AM  Result Value Ref Range   Lactic Acid, Venous 0.70 0.5 - 1.9 mmol/L  Urinalysis, Routine w reflex microscopic (not at [facility])     Status: Abnormal   Collection Time: 11/20/15 10:35 AM  Result Value Ref Range   Color, Urine AMBER (A) YELLOW    Comment: BIOCHEMICALS MAY BE AFFECTED BY COLOR   APPearance CLOUDY (A) CLEAR   Specific Gravity, Urine 1.011 1.005 - 1.030   pH 6.0 5.0 - 8.0   Glucose, UA NEGATIVE NEGATIVE mg/dL   Hgb urine dipstick LARGE (A) NEGATIVE   Bilirubin Urine SMALL (A) NEGATIVE   Ketones, ur 15 (A) NEGATIVE mg/dL   Protein, ur NEGATIVE NEGATIVE mg/dL   Nitrite  NEGATIVE NEGATIVE   Leukocytes, UA MODERATE (A) NEGATIVE  Protein / creatinine ratio, urine     Status: Abnormal   Collection Time: 11/20/15 10:35 AM  Result Value Ref Range   Creatinine, Urine 46.07 mg/dL   Total Protein, Urine 10 mg/dL    Comment: NO NORMAL RANGE ESTABLISHED FOR THIS TEST   Protein Creatinine  Ratio 0.22 (H) 0.00 - 0.15 mg/mg[Cre]  Urine microscopic-add on     Status: Abnormal   Collection Time: 11/20/15 10:35 AM  Result Value Ref Range   Squamous Epithelial / LPF 6-30 (A) NONE SEEN   WBC, UA 6-30 0 - 5 WBC/hpf   RBC / HPF 0-5 0 - 5 RBC/hpf   Bacteria, UA FEW (A) NONE SEEN   US Abdomen Limited Ruq  Result Date: 11/18/2015 CLINICAL DATA:  Right upper quadrant pain for 1 week. EXAM: US ABDOMEN LIMITED - RIGHT UPPER QUADRANT COMPARISON:  None. FINDINGS: Gallbladder: An 8 mm non shadowing hyperechoic region is seen in the gallbladder consistent with non shadowing stones versus sludge. No wall thickening or pericholecystic fluid. No Murphy's sign. Common bile duct: Diameter: 10 mm. Shadowing in the region of the distal common bile duct measuring up to 7 mm, worrisome for a distal common bile duct stone. Liver: Intrahepatic ductal dilatation.  No focal mass. IMPRESSION: 1. Intra and extrahepatic ductal dilatation with a 7 mm hyperechoic focus in the distal common bile duct. This constellation of findings is highly concerning for choledocholithiasis. An MRCP could better evaluate. 2. Small stones or sludge in the gallbladder. No wall thickening, pericholecystic fluid, or Murphy's sign. Electronically Signed   By: Dorise Bullion III M.D   On: 11/18/2015 16:05   Assessment/Plan Ascending cholangitis  Symptomatic cholelithiasis Gallstone pancreatitis - lipase 3,802  - AST 352, ALT 650, Alk Phos 246, t.bili 5.8 - RUQ U/S 10/8 - dilated common bile duct, possible stone  - Suspect choledocholithiasis: GI consulted, repeating RUQ U/S, possible ERCP - blood cultures pending - general  surgery will plan to perform laparoscopic cholecystectomy after anticipated ERCP w/ stone removal.   Jill Alexanders, [facility] 11/20/2015, 12:49 PM Pager: [phone] Consults: [phone] Mon-Fri 7:00 am-4:30 pm Sat-Sun 7:00 am-11:30 am

## 2015-11-20 NOTE — H&P (Signed)
   Date: 11/20/2015               Patient Name:  Gail George MRN: [id number]  DOB: 01/16/1986 Age / Sex: 30 y.o., female   PCP: No Pcp Per Patient         Medical Service: Internal Medicine Teaching Service         Attending Physician: Dr. Emily B Mullen, MD    First Contact: Dr. Amin Pager: [phone]  Second Contact: Dr. Wallace Pager: [phone]       After Hours (After 5p/  First Contact Pager: [phone]  weekends / holidays): Second Contact Pager: [phone]   Chief Complaint: RUQ abdominal pain with nausea and vomiting.  History of Present Illness: Gail George is a 30 y. o female,With past medical history of PCO S and hypothyroidism , 6 weeks postpartum,vaginal delivery of her 2nd child on 8/26. presented to the ED with complaint of worsening right upper quadrant pain accompanied with nausea and vomiting for 1 day. She states that, she was diagnosed with gallstones during her second pregnancy,between 22-32 wks. when she started getting upper right quadrant pain. She never had any intervention done at that point was she was too far along for surgery. Her pain eventually subsides around 32 weeks. She'll be started getting intermittent right upper quadrant abdominal pain with some nausea and vomiting, 7-10 days ago. She went to [place] ED on Saturday, October 7.US abdomen done shows 10mm CBD.7 mm hyperechoic focus in the distal common bile duct. Findings  "highly concerning for choledocholithiasis. Her symptoms improved, and she decided to go home with a follow-up with GI and surgery. Last night her pain  Restarted, she also complain of some fever and chills yesterday night, her pain was 10 over 10, mostly in right upper quadrant radiating to the back, accompanied with nausea and vomiting. Currently her pain was 6/10 after Dilantin. Husband states that he noticed some yellowish discoloration of her skin and eyes over the last 2 days. She do endorse some loss of appetite. She denies any  hematemesis, diarrhea, dysuria, any abnormal vaginal discharge or bleeding. She is nursing her baby.   Meds:  Current Meds  Medication Sig  . HYDROcodone-acetaminophen (NORCO/VICODIN) 5-325 MG tablet Take 1-2 tablets by mouth every 6 (six) hours as needed.  . metFORMIN (GLUMETZA) 500 MG (MOD) 24 hr tablet Take 500 mg by mouth every evening.   . ondansetron (ZOFRAN) 4 MG tablet Take 1 tablet (4 mg total) by mouth every 6 (six) hours.  . Prenatal Vit-Fe Fumarate-FA (PRENATAL MULTIVITAMIN) TABS tablet Take 1 tablet by mouth daily at 12 noon.  . thyroid (ARMOUR) 15 MG tablet Take 15 mg by mouth daily. Pt takes with 60 mg to equal 75 mg  . thyroid (ARMOUR) 60 MG tablet Take 60 mg by mouth daily before breakfast. Pt takes with 15 mg to equal 75 mg     Allergies: Allergies as of 11/20/2015  . (No Known Allergies)   Past Medical History:  Diagnosis Date  . Hypothyroidism   . PCOS (polycystic ovarian syndrome)     Family History: Colon cancer - grandfather diagnosed > age of 60.                           Breast cancer- mother at the age of 57  Social History: Never smoked, no history of alcohol use, denies any illicit drugs.  Review of Systems: A complete ROS was   negative except as per HPI.   Physical Exam: Blood pressure 132/68, pulse 75, resp. rate 11, last menstrual period 12/25/2014, SpO2 98 %.  Vitals:   11/20/15 1200 11/20/15 1246 11/20/15 1253 11/20/15 1300  BP: 125/87 128/85 120/81   Pulse: 75 76 64   Resp: _0 Temp:  98.4 F (36.9 C)    TempSrc:  Oral    SpO2: 92% 93% (!) 88% 96%   General: Vital signs reviewed.  Patient is well-developed and well-nourished,Seems uncomfortable due to pain,  and cooperative with exam.  Head: Normocephalic and atraumatic. Eyes: EOMI, conjunctivae normal, mild scleral icterus.   Cardiovascular: RRR, S1 normal, S2 normal, no murmurs, gallops, or rubs. Pulmonary/Chest: Clear to auscultation bilaterally, no wheezes, rales, or  rhonchi. Abdominal: Soft, tender right and left upper quadrant and epigastrium more on the right ,non-distended, BS hypoactive, Extremities: No lower extremity edema bilaterally,  pulses symmetric and intact bilaterally. No cyanosis or clubbing. Neurological: A&O x3, Strength is normal and symmetric bilaterally, cranial nerve II-XII are grossly intact, no focal motor deficit, sensory intact to light touch bilaterally.  Skin: Warm, dry and intact. No rashes or erythema. Psychiatric: Normal mood and affect. speech and behavior is normal. Cognition and memory are normal.   Labs. CMP Latest Ref Rng & Units 11/20/2015 11/18/2015  Glucose 65 - 99 mg/dL 133(H) 93  BUN 6 - 20 mg/dL 8 15  Creatinine 0.44 - 1.00 mg/dL 0.74 0.58  Sodium 135 - 145 mmol/L 139 137  Potassium 3.5 - 5.1 mmol/L 4.1 4.1  Chloride 101 - 111 mmol/L 108 104  CO2 22 - 32 mmol/L 21(L) 25  Calcium 8.9 - 10.3 mg/dL 9.5 9.5  Total Protein 6.5 - 8.1 g/dL 7.1 7.2  Total Bilirubin 0.3 - 1.2 mg/dL 5.8(H) 1.6(H)  Alkaline Phos 38 - 126 U/L 246(H) 117  AST 15 - 41 U/L 352(H) 190(H)  ALT 14 - 54 U/L 650(H) 153(H)   CBC    Component Value Date/Time   WBC 22.2 (H) 11/20/2015 0929   RBC 4.88 11/20/2015 0929   HGB 14.4 11/20/2015 0929   HCT 44.2 11/20/2015 0929   PLT 262 11/20/2015 0929   MCV 90.6 11/20/2015 0929   MCH 29.5 11/20/2015 0929   MCHC 32.6 11/20/2015 0929   RDW 12.2 11/20/2015 0929   LYMPHSABS 1.9 11/20/2015 0929   MONOABS 1.2 (H) 11/20/2015 0929   EOSABS 0.2 11/20/2015 0929   BASOSABS 0.0 11/20/2015 0929   Lipase. 3,802  Lactic acid.0.70  Urinalysis    Component Value Date/Time   COLORURINE AMBER (A) 11/20/2015 1035   APPEARANCEUR CLOUDY (A) 11/20/2015 1035   LABSPEC 1.011 11/20/2015 1035   PHURINE 6.0 11/20/2015 1035   GLUCOSEU NEGATIVE 11/20/2015 1035   HGBUR LARGE (A) 11/20/2015 1035   BILIRUBINUR SMALL (A) 11/20/2015 1035   KETONESUR 15 (A) 11/20/2015 1035   PROTEINUR NEGATIVE 11/20/2015 1035    NITRITE NEGATIVE 11/20/2015 1035   LEUKOCYTESUR MODERATE (A) 11/20/2015 1035   Urine microscopic-add on  Order: [id number]  Status:  Final result Visible to patient:  No (Not Released) Next appt:  None    Ref Range & Units 10:35 2d ago   Squamous Epithelial / LPF NONE SEEN 6-30   0-5     WBC, UA 0 - 5 WBC/hpf 6-30  0-5    RBC / HPF 0 - 5 RBC/hpf 0-5  NONE SEEN    Bacteria, UA NONE SEEN FEW   MANY    Resulting Agency  [facility]    Specimen Collected: 11/20/15 10:35          EKG: Normal sinus rhythm. No acute changes.   Assessment & Plan by Problem:   Mrs. Aragones is a 29 y. o female,With past medical history of PCO S and hypothyroidism , 6 weeks postpartum,vaginal delivery of her 2nd child on 8/26. presented to the ED with complaint of worsening right upper quadrant pain accompanied with nausea and vomiting for 1 day. Her differential include cholangitis, choledocholithiasis with obstruction and pancreatitis.  HELLP,Endometritis and UTI might be another possibility, less likely, as she to have a good prenatal and postnatal care. She denies any abnormal vaginal discharge or bleeding. She was GBS negative. Her platelets Are normal, no history of Pre eclampsia, or Eclampsia and no protein in her urine. Her urine does show moderate amount of leukocytes with a few bacteria in there, but her symptoms does not point to a UTI as a cause of her current illness.  Cholangitis. It is the most likely diagnosis as she met all the criteria, having some fever with chills, leukocytosis, jaundice, abnormal liver enzymes and biliary dilation. She needs ERCP. GI was consulted they wanted to have a repeat abdominal ultrasound. -Abdominal ultrasound. -IV Zosyn -Fluid resuscitation, either with IV normal saline or Ringer's lactate. -Dilaudid 2 mg every 4 hourly if needed for pain.  Choledocholithiasis with obstruction. Her right upper quadrant abdominal ultrasound done on October 8 shows  intra-and extrahepatic ductal diabetic dictation with this 7 mm hypoechoic focus in the distal common bile duct. Which was highly concerning for choledocholithiasis. -MRCP for small stones or sludge in the gallbladder and to remove obstruction. -Surgery was consulted for possible laparoscopic cholecystectomy, they want MRCP to be done first.  Acute pancreatitis. Her lipase was 3802. She has no history of alcohol abuse, no previous episode of pancreatitis.Most probably due to obstruction. -Lipid panel to rule out any hypertriglyceridemia. -IV fluid resuscitation. -Nothing by mouth.  Hypothyroidism. Continue his home dose of Armour 75 mg daily.  PCOS. She is on metformin 500 mg daily at home. We will hold her metformin.  DVT. SCD Code. Full Diet. NPO.  Dispo: Admit patient to Inpatient with expected length of stay greater than 2 midnights.  Signed: Sumayya Amin, MD 11/20/2015, 11:32 AM  Pager: [phone] 

## 2015-11-20 NOTE — ED Notes (Signed)
Breast pump ordered from materials management

## 2015-11-20 NOTE — ED Triage Notes (Signed)
Abdominal pain since Sunday and now N/V. Pt. Has vomited twice this AM and. Took 1 vicodin on her own this AM and received 4 of zofran in route. Pain is still 10/10. Meeting with surgeon to have gallbladder removed soon.

## 2015-11-20 NOTE — Progress Notes (Signed)
Pharmacy Antibiotic Note  Gail George is a 30 y.o. female admitted on 11/20/2015 with cholangitis.  Pharmacy has been consulted for zosyn dosing.  Plan: Zosyn 3.375g IV q8h (4 hour infusion).     No data recorded.   Recent Labs Lab 11/18/15 1650 11/20/15 0929 11/20/15 0935  WBC 14.6* 22.2*  --   CREATININE 0.58 0.74  --   LATICACIDVEN  --   --  0.70    CrCl cannot be calculated (Unknown ideal weight.).    No Known Allergies  Isaac BlissMichael Kalle Bernath, PharmD, BCPS, [facility] Clinical Pharmacist Pager [phone] 11/20/2015 10:40 AM

## 2015-11-21 ENCOUNTER — Encounter (HOSPITAL_COMMUNITY): Payer: Self-pay | Admitting: Anesthesiology

## 2015-11-21 ENCOUNTER — Inpatient Hospital Stay (HOSPITAL_COMMUNITY)

## 2015-11-21 ENCOUNTER — Encounter (HOSPITAL_COMMUNITY)
Admission: EM | Disposition: A | Discharge status: Home / Self Care | Payer: Self-pay | Source: Home / Self Care | Attending: Internal Medicine

## 2015-11-21 DIAGNOSIS — K838 Other specified diseases of biliary tract: Secondary | ICD-10-CM

## 2015-11-21 LAB — CBC
HCT: 45.4 % (ref 36.0–46.0)
Hemoglobin: 14.8 g/dL (ref 12.0–15.0)
MCH: 29.4 pg (ref 26.0–34.0)
MCHC: 32.6 g/dL (ref 30.0–36.0)
MCV: 90.1 fL (ref 78.0–100.0)
PLATELETS: 263 10*3/uL (ref 150–400)
RBC: 5.04 MIL/uL (ref 3.87–5.11)
RDW: 12.5 % (ref 11.5–15.5)
WBC: 17.5 10*3/uL — AB (ref 4.0–10.5)

## 2015-11-21 LAB — COMPREHENSIVE METABOLIC PANEL
ALK PHOS: 190 U/L — AB (ref 38–126)
ALT: 380 U/L — ABNORMAL HIGH (ref 14–54)
AST: 107 U/L — AB (ref 15–41)
Albumin: 3.2 g/dL — ABNORMAL LOW (ref 3.5–5.0)
Anion gap: 9 (ref 5–15)
BUN: 12 mg/dL (ref 6–20)
CALCIUM: 8.8 mg/dL — AB (ref 8.9–10.3)
CHLORIDE: 105 mmol/L (ref 101–111)
CO2: 24 mmol/L (ref 22–32)
CREATININE: 0.79 mg/dL (ref 0.44–1.00)
Glucose, Bld: 101 mg/dL — ABNORMAL HIGH (ref 65–99)
Potassium: 4 mmol/L (ref 3.5–5.1)
Sodium: 138 mmol/L (ref 135–145)
Total Bilirubin: 2.8 mg/dL — ABNORMAL HIGH (ref 0.3–1.2)
Total Protein: 6.1 g/dL — ABNORMAL LOW (ref 6.5–8.1)

## 2015-11-21 LAB — LIPASE, BLOOD: LIPASE: 658 U/L — AB (ref 11–51)

## 2015-11-21 LAB — AMYLASE: Amylase: 598 U/L — ABNORMAL HIGH (ref 28–100)

## 2015-11-21 LAB — URINE CULTURE

## 2015-11-21 SURGERY — CANCELLED PROCEDURE

## 2015-11-21 NOTE — Anesthesia Preprocedure Evaluation (Deleted)
Anesthesia Evaluation  Patient identified by MRN, date of birth, ID band Patient awake    Reviewed: Allergy & Precautions, H&P , NPO status , Patient's Chart, lab work & pertinent test results  History of Anesthesia Complications Negative for: history of anesthetic complications  Airway Mallampati: II  TM Distance: >3 FB Neck ROM: full    Dental no notable dental hx.    Pulmonary neg pulmonary ROS,    Pulmonary exam normal breath sounds clear to auscultation       Cardiovascular negative cardio ROS Normal cardiovascular exam Rhythm:regular Rate:Normal     Neuro/Psych negative neurological ROS     GI/Hepatic negative GI ROS, Neg liver ROS,   Endo/Other  Hypothyroidism   Renal/GU negative Renal ROS     Musculoskeletal   Abdominal   Peds  Hematology negative hematology ROS (+)   Anesthesia Other Findings PCOS  Reproductive/Obstetrics negative OB ROS                             Anesthesia Physical Anesthesia Plan  ASA: II  Anesthesia Plan: General   Post-op Pain Management:    Induction: Intravenous  Airway Management Planned: Oral ETT  Additional Equipment:   Intra-op Plan:   Post-operative Plan:   Informed Consent: I have reviewed the patients History and Physical, chart, labs and discussed the procedure including the risks, benefits and alternatives for the proposed anesthesia with the patient or authorized representative who has indicated his/her understanding and acceptance.   Dental Advisory Given  Plan Discussed with: Anesthesiologist, CRNA and Surgeon  Anesthesia Plan Comments:         Anesthesia Quick Evaluation

## 2015-11-21 NOTE — Progress Notes (Signed)
   Patient Name: Gail George Date of Encounter: 11/21/2015, 9:34 AM    Assessment and Plan  Acute biliary pancreatitis Dilated CBD and suspected CBD stone   Her Sxs, exam and LFT's and pancreatic #'s and WBC are down.  This suggests stone passage vs stone moved up bile duct and not impacted.  Plan change to  MRCP and will see if stone has passed - if not then will do an ERCP  Iva Booparl E. Vernis Eid, MD, [facility] [phone] (pager) [phone] after 5 PM, weekends and holidays  11/21/2015 9:37 AM   Iva Booparl E. Sohil Timko, MD, [facility]     Subjective  Intense epigastric pain is gone. Now morediffuse bloating and abdominal pain but not as severe. No flatus or stool.   Objective  BP 109/69 (BP Location: Right Arm)   Pulse (!) 103   Temp 98.4 F (36.9 C) (Oral)   Resp 20   Ht 5\' 9"  (1.753 m)   Wt 199 lb 15.7 oz (90.7 kg)   LMP 12/25/2014 (Exact Date) Comment: Breastfeeding  SpO2 97%   BMI 29.53 kg/m  NAD Abd is quiet and soft, mildly tender Sl scleral icterus  Lab Results  Component Value Date   ALT 380 (H) 11/21/2015   AST 107 (H) 11/21/2015   ALKPHOS 190 (H) 11/21/2015   BILITOT 2.8 (H) 11/21/2015   Lab Results  Component Value Date   LIPASE 658 (H) 11/21/2015   Lab Results  Component Value Date   AMYLASE 598 (H) 11/21/2015       [phone] (pager) [phone] after 5 PM, weekends and holidays  11/21/2015 9:34 AM

## 2015-11-21 NOTE — Progress Notes (Signed)
Subjective: She was feeling better this morning her pain has improved, still around 6/10 in intensity, she denies any more nausea or vomiting.   Objective:  Vital signs in last 24 hours: Vitals:   11/20/15 2121 11/21/15 0128 11/21/15 0548 11/21/15 0905  BP: 115/71 106/71 (!) 93/58 109/69  Pulse: 100 97 94 (!) 103  Resp: 20 20 20 20   Temp: 99.2 F (37.3 C) 99 F (37.2 C) 98.6 F (37 C) 98.4 F (36.9 C)  TempSrc: Oral Oral Oral Oral  SpO2: 99% 100% 98% 97%  Weight:      Height:       Gen. well-developed, well-nourished lady, in no acute distress. Eyes and skin. Mild scleral icterus, mild pallor skin, though much improved as compared to yesterday. Abdomen. Soft, mildly diffuse tenderness more in the right upper and left quadrant and epigastrium, much improved from yesterday. Bowel sounds present. Lungs. Clear bilaterally CV. Regular rate and rhythm Extremities. No edema, no cyanosis, pulses 2+ bilaterally.  Labs. CBC Latest Ref Rng & Units 11/21/2015 11/20/2015 11/18/2015  WBC 4.0 - 10.5 K/uL 17.5(H) 22.2(H) 14.6(H)  Hemoglobin 12.0 - 15.0 g/dL 16.1 09.6 04.5  Hematocrit 36.0 - 46.0 % 45.4 44.2 38.8  Platelets 150 - 400 K/uL 263 262 269   CMP Latest Ref Rng & Units 11/21/2015 11/20/2015 11/18/2015  Glucose 65 - 99 mg/dL 409(W) 119(J) 93  BUN 6 - 20 mg/dL 12 8 15   Creatinine 0.44 - 1.00 mg/dL 4.78 2.95 6.21  Sodium 135 - 145 mmol/L 138 139 137  Potassium 3.5 - 5.1 mmol/L 4.0 4.1 4.1  Chloride 101 - 111 mmol/L 105 108 104  CO2 22 - 32 mmol/L 24 21(L) 25  Calcium 8.9 - 10.3 mg/dL 3.0(Q) 9.5 9.5  Total Protein 6.5 - 8.1 g/dL 6.1(L) 7.1 7.2  Total Bilirubin 0.3 - 1.2 mg/dL 2.8(H) 5.8(H) 1.6(H)  Alkaline Phos 38 - 126 U/L 190(H) 246(H) 117  AST 15 - 41 U/L 107(H) 352(H) 190(H)  ALT 14 - 54 U/L 380(H) 650(H) 153(H)   Lipid Panel     Component Value Date/Time   CHOL 188 11/20/2015 1509   TRIG 36 11/20/2015 1509   HDL 105 11/20/2015 1509   CHOLHDL 1.8 11/20/2015 1509   VLDL 7 11/20/2015 1509   LDLCALC 76 11/20/2015 1509   Lipase. 658  <<< 3,802 Amylase. 598  US Abdomen. IMPRESSION: Intrahepatic and extrahepatic biliary dilatation. Common bile duct is stable in size from the previous examination, measuring up to 1.0 cm. Stone is not confidently identified on this examination but limited evaluation of the distal common bile duct. Findings remain concerning for a distal common bile duct stone. This could be better characterized with MRCP.   Assessment/Plan:  Gail George is a 30 y. o female,With past medical history of PCO S and hypothyroidism , 6 weeks postpartum,vaginal delivery of her 2nd child on 8/26. presented to the ED with complaint of worsening right upper quadrant pain accompanied with nausea and vomiting for 1 day.  Cholangitis. Her pain and leukocytosis has improved, she remained afebrile. Her liver enzymes are trending down. Her repeat abdominal ultrasound was positive for the stable findings as in her previous ultrasound. --Continue IV Zosyn -Continue Fluid resuscitation, either with IV normal saline or Ringer's lactate. -Continue Dilaudid 2 mg every 4 hourly if needed for pain.  Choledocholithiasis with obstruction. There is no change in her repeat abdominal ultrasound, as her condition has improved we trending liver enzymes.GI was thinking that the stone might  have passed .they recommend doing MRCP first and then ERCP if needed.  Acute pancreatitis. Her lipase is trending down , it was 658 this morning as compared to above 3000 yesterday.her triglycerides were normal . We will continue with fluid resuscitation.. For the results of her MRCP. Her calcium was 8.8 this morning, as compared to 9.5 yesterday. -Monitor her calcium level.   Dispo: Anticipated discharge in approximately 1-2 day(s).   Gail CourserSumayya Ryann Pauli, MD 11/21/2015, 10:27 AM Pager: [phone]

## 2015-11-21 NOTE — Progress Notes (Signed)
Day of Surgery  Subjective: Feels better today but now feels bloated. Some upper abd pain. No emesis.   Objective: Vital signs in last 24 hours: Temp:  [98.4 F (36.9 C)-100 F (37.8 C)] 99.7 F (37.6 C) (10/11 2118) Pulse Rate:  [94-110] 110 (10/11 2118) Resp:  [16-20] 16 (10/11 2118) BP: (93-109)/(58-71) 103/60 (10/11 2118) SpO2:  [96 %-100 %] 100 % (10/11 2118) Last BM Date: 11/19/15  Intake/Output from previous day: 10/10 0701 - 10/11 0700 In: 4496.7 [I.V.:3346.7; IV Piggyback:1150] Out: 1050 [Urine:1050] Intake/Output this shift: Total I/O In: 480 [I.V.:480] Out: -   Awake, alert, nontoxic Mild scleral icterus cta Reg Soft, some bloating, epigastric TTP, no rebound/guarding  Lab Results:   Recent Labs  11/20/15 0929 11/21/15 0433  WBC 22.2* 17.5*  HGB 14.4 14.8  HCT 44.2 45.4  PLT 262 263   BMET  Recent Labs  11/20/15 0929 11/21/15 0433  NA 139 138  K 4.1 4.0  CL 108 105  CO2 21* 24  GLUCOSE 133* 101*  BUN 8 12  CREATININE 0.74 0.79  CALCIUM 9.5 8.8*   Hepatic Function Latest Ref Rng & Units 11/21/2015 11/20/2015 11/18/2015  Total Protein 6.5 - 8.1 g/dL 6.1(L) 7.1 7.2  Albumin 3.5 - 5.0 g/dL 3.2(L) 3.9 4.2  AST 15 - 41 U/L 107(H) 352(H) 190(H)  ALT 14 - 54 U/L 380(H) 650(H) 153(H)  Alk Phosphatase 38 - 126 U/L 190(H) 246(H) 117  Total Bilirubin 0.3 - 1.2 mg/dL 2.8(H) 5.8(H) 1.6(H)   Lipase     Component Value Date/Time   LIPASE 658 (H) 11/21/2015 0433    PT/INR No results for input(s): LABPROT, INR in the last 72 hours. ABG No results for input(s): PHART, HCO3 in the last 72 hours.  Invalid input(s): PCO2, PO2  Studies/Results: US Abdomen Limited  Result Date: 11/20/2015 CLINICAL DATA:  Choledocholithiasis.  Six weeks postpartum. EXAM: US ABDOMEN LIMITED - RIGHT UPPER QUADRANT COMPARISON:  11/18/2015 FINDINGS: Gallbladder: Mild to moderate distention of the gallbladder without gallbladder without wall thickening. Reportedly, the  patient does not have a sonographic Murphy's sign. There is at least 1 small echogenic structure in the gallbladder measuring 0.5 cm. Common bile duct: Diameter: Measures up to 1.0 cm and stable. A biliary stone is not confidently identified but the distal common bile duct is difficult to evaluate. Liver: No focal lesion identified. Normal echogenicity in the liver. There is mild to moderate intrahepatic biliary dilatation. IMPRESSION: Intrahepatic and extrahepatic biliary dilatation. Common bile duct is stable in size from the previous examination, measuring up to 1.0 cm. Stone is not confidently identified on this examination but limited evaluation of the distal common bile duct. Findings remain concerning for a distal common bile duct stone. This could be better characterized with MRCP. Mild to moderate distention of the gallbladder without wall thickening. There is at least one small gallstone. Electronically Signed   By: Markus Daft M.D.   On: 11/20/2015 16:09    Anti-infectives: Anti-infectives    Start     Dose/Rate Route Frequency Ordered Stop   11/20/15 1800  piperacillin-tazobactam (ZOSYN) IVPB 3.375 g     3.375 g 12.5 mL/hr over 240 Minutes Intravenous Every 8 hours 11/20/15 1039     11/20/15 1045  piperacillin-tazobactam (ZOSYN) IVPB 3.375 g     3.375 g 100 mL/hr over 30 Minutes Intravenous  Once 11/20/15 1039 11/20/15 1120      Assessment/Plan: Cholelithiasis Elevated LFTs, possible cholangitis Pancreatitis  LFTs down some today, cbd  duct dilated on repeat u/s but no def stone. Wbc better. Looks clinically better. Believe the bloating/epigastric pain is from pancreatitis  Continue IVF  Cont IV abx for possible cholangitis Although LFTs trended down some, ideally needs to have duct elevated first. Defer to GI whether MRCP or ERCP Discuss with GI  Discussed with mother via phone  Leighton Ruff. Redmond Pulling, MD, FACS General, Bariatric, & Minimally Invasive Surgery [facility]  [facility], [place]   LOS: 1 day    Gayland Curry 11/21/2015

## 2015-11-22 LAB — URINALYSIS, ROUTINE W REFLEX MICROSCOPIC
Glucose, UA: NEGATIVE mg/dL
Ketones, ur: 15 mg/dL — AB
NITRITE: POSITIVE — AB
PH: 6 (ref 5.0–8.0)
Protein, ur: 100 mg/dL — AB
SPECIFIC GRAVITY, URINE: 1.044 — AB (ref 1.005–1.030)

## 2015-11-22 LAB — COMPREHENSIVE METABOLIC PANEL
ALBUMIN: 2.5 g/dL — AB (ref 3.5–5.0)
ALK PHOS: 101 U/L (ref 38–126)
ALT: 174 U/L — ABNORMAL HIGH (ref 14–54)
ALT: 146 U/L — ABNORMAL HIGH (ref 14–54)
ANION GAP: 6 (ref 5–15)
AST: 36 U/L (ref 15–41)
AST: 38 U/L (ref 15–41)
Albumin: 2.5 g/dL — ABNORMAL LOW (ref 3.5–5.0)
Alkaline Phosphatase: 118 U/L (ref 38–126)
Anion gap: 6 (ref 5–15)
BILIRUBIN TOTAL: 2.1 mg/dL — AB (ref 0.3–1.2)
BUN: 9 mg/dL (ref 6–20)
BUN: 9 mg/dL (ref 6–20)
CALCIUM: 8 mg/dL — AB (ref 8.9–10.3)
CHLORIDE: 106 mmol/L (ref 101–111)
CO2: 29 mmol/L (ref 22–32)
CO2: 26 mmol/L (ref 22–32)
CREATININE: 0.66 mg/dL (ref 0.44–1.00)
Calcium: 8.2 mg/dL — ABNORMAL LOW (ref 8.9–10.3)
Chloride: 106 mmol/L (ref 101–111)
Creatinine, Ser: 0.83 mg/dL (ref 0.44–1.00)
GFR calc Af Amer: >60 mL/min (ref >60)
GFR calc Af Amer: >60 mL/min (ref >60)
GFR calc non Af Amer: >60 mL/min (ref >60)
GLUCOSE: 89 mg/dL (ref 65–99)
Glucose, Bld: 103 mg/dL — ABNORMAL HIGH (ref 65–99)
POTASSIUM: 3.7 mmol/L (ref 3.5–5.1)
Potassium: 3.8 mmol/L (ref 3.5–5.1)
SODIUM: 141 mmol/L (ref 135–145)
Sodium: 138 mmol/L (ref 135–145)
TOTAL PROTEIN: 5.1 g/dL — AB (ref 6.5–8.1)
Total Bilirubin: 2 mg/dL — ABNORMAL HIGH (ref 0.3–1.2)
Total Protein: 5.1 g/dL — ABNORMAL LOW (ref 6.5–8.1)

## 2015-11-22 LAB — URINE MICROSCOPIC-ADD ON

## 2015-11-22 LAB — LIPASE, BLOOD: LIPASE: 199 U/L — AB (ref 11–51)

## 2015-11-22 LAB — CBC
HEMATOCRIT: 38.7 % (ref 36.0–46.0)
HEMOGLOBIN: 12.4 g/dL (ref 12.0–15.0)
MCH: 29.1 pg (ref 26.0–34.0)
MCHC: 32 g/dL (ref 30.0–36.0)
MCV: 90.8 fL (ref 78.0–100.0)
Platelets: 206 10*3/uL (ref 150–400)
RBC: 4.26 MIL/uL (ref 3.87–5.11)
RDW: 12.7 % (ref 11.5–15.5)
WBC: 17.5 10*3/uL — AB (ref 4.0–10.5)

## 2015-11-22 MED ORDER — ENOXAPARIN SODIUM 40 MG/0.4ML ~~LOC~~ SOLN
40.0000 mg | SUBCUTANEOUS | Status: DC
  Administered 2015-11-22 – 2015-11-23 (×2): 40 mg via SUBCUTANEOUS
  Filled 2015-11-22 (×2): qty 0.4

## 2015-11-22 MED ORDER — BISACODYL 10 MG RE SUPP
10.0000 mg | Freq: Once | RECTAL | Status: AC
  Administered 2015-11-22: 10 mg via RECTAL
  Filled 2015-11-22: qty 1

## 2015-11-22 MED ORDER — IBUPROFEN 200 MG PO TABS: 600.0000 mg | ORAL_TABLET | Freq: Four times a day (QID) | ORAL | Status: DC | PRN

## 2015-11-22 MED ORDER — ACETAMINOPHEN 325 MG PO TABS
650.0000 mg | ORAL_TABLET | Freq: Four times a day (QID) | ORAL | Status: DC | PRN
Start: 2015-11-22 — End: 2015-11-22
  Administered 2015-11-22: 650 mg via ORAL
  Filled 2015-11-22: qty 2

## 2015-11-22 MED ORDER — GADOBENATE DIMEGLUMINE 529 MG/ML IV SOLN
20.0000 mL | Freq: Once | INTRAVENOUS | Status: AC
  Administered 2015-11-21: 20 mL via INTRAVENOUS

## 2015-11-22 NOTE — Progress Notes (Signed)
Subjective: Patient was feeling better this morning. Still complains of mild nausea and abdominal pain. No bowel movements yet.  Objective:  Vital signs in last 24 hours: Vitals:   11/20/15 1345 11/22/15 0140 11/22/15 0521 11/22/15 0856  BP:  102/61 98/67 112/71  Pulse:  (!) 104 (!) 108 (!) 101  Resp:  16 16 16   Temp:  99.2 F (37.3 C) 99.2 F (37.3 C) (!) 100.4 F (38 C)  TempSrc:  Oral Oral Oral  SpO2:  100% 94% 95%  Weight: 199 lb 15.7 oz (90.7 kg)     Height: 5\' 9"  (1.753 m)      Gen. well-developed, well-nourished lady, in no acute distress. Eyes and skin. Mild scleral icterus, mild pallor skin, though much improved as compared to yesterday. Abdomen. Soft, mildly diffuse tenderness all over her abdomen , Bowel sounds present. Lungs. Clear bilaterally CV. Regular rate and rhythm Extremities. No edema, no cyanosis, pulses 2+ bilaterally.  Labs. CBC Latest Ref Rng & Units 11/22/2015 11/21/2015 11/20/2015  WBC 4.0 - 10.5 K/uL 17.5(H) 17.5(H) 22.2(H)  Hemoglobin 12.0 - 15.0 g/dL 16.1 09.6 04.5  Hematocrit 36.0 - 46.0 % 38.7 45.4 44.2  Platelets 150 - 400 K/uL 206 263 262   CMP Latest Ref Rng & Units 11/22/2015 11/21/2015 11/20/2015  Glucose 65 - 99 mg/dL 409(W) 119(J) 478(G)  BUN 6 - 20 mg/dL 9 12 8   Creatinine 0.44 - 1.00 mg/dL 9.56 2.13 0.86  Sodium 135 - 145 mmol/L 141 138 139  Potassium 3.5 - 5.1 mmol/L 3.7 4.0 4.1  Chloride 101 - 111 mmol/L 106 105 108  CO2 22 - 32 mmol/L 29 24 21(L)  Calcium 8.9 - 10.3 mg/dL 8.2(L) 8.8(L) 9.5  Total Protein 6.5 - 8.1 g/dL 5.1(L) 6.1(L) 7.1  Total Bilirubin 0.3 - 1.2 mg/dL 2.0(H) 2.8(H) 5.8(H)  Alkaline Phos 38 - 126 U/L 118 190(H) 246(H)  AST 15 - 41 U/L 36 107(H) 352(H)  ALT 14 - 54 U/L 174(H) 380(H) 650(H)   Lipase. 199      658     3802  MRCP. IMPRESSION: The pancreatic head and uncinate process are edematous and enlarged with surrounding free fluid most compatible with acute pancreatitis. No evidence for necrosis at  this time. The common bile duct is normal in diameter without evidence of filling defect. Pancreatitis may potentially be secondary to a recently passed stone however no stone is identified within the common bile duct at this time. Possible small stone within the gallbladder lumen  Assessment/Plan:  Mrs. Pownall is a 30 y. o female,With past medical history of PCO S and hypothyroidism , 6 weeks postpartum,vaginal delivery of her 2nd child on 8/26. presented to the ED with complaint of worsening right upper quadrant pain accompanied with nausea and vomiting for 1 day.  Cholangitis. Her liver enzymes are trending down.  she is still having mild abdominal discomfort and some nausea.. -Start her on clear liquids, and proceed very slowly according to her response. --Continue IV Zosyn -Continue Fluid resuscitation, eitherwith IV normal saline orRinger's lactate. -Continue Dilaudid 2 mg every 4 hourly if needed for pain.  Choledocholithiasis with obstruction. Her MRCP doesn't show any stone in the biliary duct, there were possible small stones within the gallbladder lumen.Surgery do not want to proceed with surgery at this point, stating that they want her pancreatitis to be resolved completely.  Acute pancreatitis. Her lipase is trending down , it was 199 this morning as compared to 658 yesterday.her triglycerides were normal .  We will continue with fluid resuscitation. Her MRCP is positive for acute pancreatitis, does not show any sign of necrosis. Her calcium was 8.2, with corrected calcium of 9.4. -Monitor her calcium level. --Start her on clear liquids, and proceed very slowly according to her response. If she started tolerating diet well, then she might be able to go home in a day or so, and schedule her cholecystectomy as an outpatient.   Dispo: Anticipated discharge in approximately 1-2 day(s).   Arnetha Courser, MD 11/22/2015, 11:16 AM Pager: [phone]

## 2015-11-22 NOTE — Progress Notes (Signed)
Patient's temp is 102.1 MD paged to notify.

## 2015-11-22 NOTE — Progress Notes (Signed)
1 Day Post-Op  Subjective: Still with abd pain. Bloated. Feels bad. No flatus. Low grade temp. Had MRI overnight  Objective: Vital signs in last 24 hours: Temp:  [98.7 F (37.1 C)-100.4 F (38 C)] 100.4 F (38 C) (10/12 0856) Pulse Rate:  [101-110] 101 (10/12 0856) Resp:  [16] 16 (10/12 0856) BP: (98-112)/(60-71) 112/71 (10/12 0856) SpO2:  [94 %-100 %] 95 % (10/12 0856) Last BM Date: 11/19/15  Intake/Output from previous day: 10/11 0701 - 10/12 0700 In: 3030 [I.V.:2880; IV Piggyback:150] Out: -  Intake/Output this shift: No intake/output data recorded.  Looks a little ill cta Soft, some distension, ttp throughout upper abd  Lab Results:   Recent Labs  11/21/15 0433 11/22/15 0613  WBC 17.5* 17.5*  HGB 14.8 12.4  HCT 45.4 38.7  PLT 263 206   BMET  Recent Labs  11/21/15 0433 11/22/15 0613  NA 138 141  K 4.0 3.7  CL 105 106  CO2 24 29  GLUCOSE 101* 103*  BUN 12 9  CREATININE 0.79 0.83  CALCIUM 8.8* 8.2*   PT/INR No results for input(s): LABPROT, INR in the last 72 hours. ABG No results for input(s): PHART, HCO3 in the last 72 hours.  Invalid input(s): PCO2, PO2  Studies/Results: Mr 3d Recon At Scanner  Result Date: 11/22/2015 CLINICAL DATA:  Patient with right upper quadrant pain. Previous ultrasound suggesting dilated common bile duct. EXAM: MRI ABDOMEN WITHOUT AND WITH CONTRAST (INCLUDING MRCP) TECHNIQUE: Multiplanar multisequence MR imaging of the abdomen was performed both before and after the administration of intravenous contrast. Heavily T2-weighted images of the biliary and pancreatic ducts were obtained, and three-dimensional MRCP images were rendered by post processing. CONTRAST:  17 mm MultiHance COMPARISON:  Right upper quadrant ultrasound 11/20/2015. FINDINGS: Lower chest: Unremarkable Hepatobiliary: Liver is normal in size and contour. No focal hepatic lesion is identified. Gallbladder is unremarkable. Possible small stone within the  gallbladder lumen. No intrahepatic or extrahepatic biliary ductal dilatation. No filling defect identified within the common bile duct. Pancreas: The pancreatic head and uncinate process are enlarged and edematous. There is a marked amount of surrounding soft tissue stranding and fluid. Mild edema involving the pancreatic body and tail. No evidence for pancreatic necrosis. Spleen:  Unremarkable Adrenals/Urinary Tract: Normal adrenal glands. Kidneys enhance symmetrically with contrast. No hydronephrosis. Stomach/Bowel: No abnormal bowel wall thickening or evidence for bowel obstruction. Normal morphology of the stomach. Mild likely reactive wall thickening of the duodenum. Vascular/Lymphatic: Normal caliber abdominal aorta. No retroperitoneal lymphadenopathy. Other: Moderate amount of free fluid and mesenteric fat stranding predominately within the central upper abdomen right upper quadrant near the porta hepatis. Musculoskeletal: No aggressive or acute appearing osseous lesions. IMPRESSION: The pancreatic head and uncinate process are edematous and enlarged with surrounding free fluid most compatible with acute pancreatitis. No evidence for necrosis at this time. The common bile duct is normal in diameter without evidence of filling defect. Pancreatitis may potentially be secondary to a recently passed stone however no stone is identified within the common bile duct at this time. Possible small stone within the gallbladder lumen. Electronically Signed   By: Annia Belt M.D.   On: 11/22/2015 08:53   US Abdomen Limited  Result Date: 11/20/2015 CLINICAL DATA:  Choledocholithiasis.  Six weeks postpartum. EXAM: US ABDOMEN LIMITED - RIGHT UPPER QUADRANT COMPARISON:  11/18/2015 FINDINGS: Gallbladder: Mild to moderate distention of the gallbladder without gallbladder without wall thickening. Reportedly, the patient does not have a sonographic Murphy's sign. There is at least  1 small echogenic structure in the  gallbladder measuring 0.5 cm. Common bile duct: Diameter: Measures up to 1.0 cm and stable. A biliary stone is not confidently identified but the distal common bile duct is difficult to evaluate. Liver: No focal lesion identified. Normal echogenicity in the liver. There is mild to moderate intrahepatic biliary dilatation. IMPRESSION: Intrahepatic and extrahepatic biliary dilatation. Common bile duct is stable in size from the previous examination, measuring up to 1.0 cm. Stone is not confidently identified on this examination but limited evaluation of the distal common bile duct. Findings remain concerning for a distal common bile duct stone. This could be better characterized with MRCP. Mild to moderate distention of the gallbladder without wall thickening. There is at least one small gallstone. Electronically Signed   By: Richarda OverlieAdam  Henn M.D.   On: 11/20/2015 16:09   Mr Roe Coombsbd W/wo Cm/mrcp  Result Date: 11/22/2015 CLINICAL DATA:  Patient with right upper quadrant pain. Previous ultrasound suggesting dilated common bile duct. EXAM: MRI ABDOMEN WITHOUT AND WITH CONTRAST (INCLUDING MRCP) TECHNIQUE: Multiplanar multisequence MR imaging of the abdomen was performed both before and after the administration of intravenous contrast. Heavily T2-weighted images of the biliary and pancreatic ducts were obtained, and three-dimensional MRCP images were rendered by post processing. CONTRAST:  17 mm MultiHance COMPARISON:  Right upper quadrant ultrasound 11/20/2015. FINDINGS: Lower chest: Unremarkable Hepatobiliary: Liver is normal in size and contour. No focal hepatic lesion is identified. Gallbladder is unremarkable. Possible small stone within the gallbladder lumen. No intrahepatic or extrahepatic biliary ductal dilatation. No filling defect identified within the common bile duct. Pancreas: The pancreatic head and uncinate process are enlarged and edematous. There is a marked amount of surrounding soft tissue stranding and  fluid. Mild edema involving the pancreatic body and tail. No evidence for pancreatic necrosis. Spleen:  Unremarkable Adrenals/Urinary Tract: Normal adrenal glands. Kidneys enhance symmetrically with contrast. No hydronephrosis. Stomach/Bowel: No abnormal bowel wall thickening or evidence for bowel obstruction. Normal morphology of the stomach. Mild likely reactive wall thickening of the duodenum. Vascular/Lymphatic: Normal caliber abdominal aorta. No retroperitoneal lymphadenopathy. Other: Moderate amount of free fluid and mesenteric fat stranding predominately within the central upper abdomen right upper quadrant near the porta hepatis. Musculoskeletal: No aggressive or acute appearing osseous lesions. IMPRESSION: The pancreatic head and uncinate process are edematous and enlarged with surrounding free fluid most compatible with acute pancreatitis. No evidence for necrosis at this time. The common bile duct is normal in diameter without evidence of filling defect. Pancreatitis may potentially be secondary to a recently passed stone however no stone is identified within the common bile duct at this time. Possible small stone within the gallbladder lumen. Electronically Signed   By: Annia Beltrew  Davis M.D.   On: 11/22/2015 08:53    Anti-infectives: Anti-infectives    Start     Dose/Rate Route Frequency Ordered Stop   11/20/15 1800  piperacillin-tazobactam (ZOSYN) IVPB 3.375 g     3.375 g 12.5 mL/hr over 240 Minutes Intravenous Every 8 hours 11/20/15 1039     11/20/15 1045  piperacillin-tazobactam (ZOSYN) IVPB 3.375 g     3.375 g 100 mL/hr over 30 Minutes Intravenous  Once 11/20/15 1039 11/20/15 1120      Assessment/Plan: Symptomatic cholelithiasis Elevated lfts, wbc, dilated duct concerning for cholangitis Pancreatitis  MRI report wasn't back when I rounded on her this am. But report confirmed my suspicion that although her pancreas enzymes are better she clinically and radiographically has pancreatitis.  No signs of cholecystitis  Bowel rest, IV fluid resuscitation Have to hold on cholecystectomy til pancreatitis improves Pancreatitis mgmt o/w per GI  Mary Sella. Andrey Campanile, MD, FACS General, Bariatric, & Minimally Invasive Surgery [facility], [place]   LOS: 2 days    Atilano Ina 11/22/2015

## 2015-11-22 NOTE — Progress Notes (Signed)
Daily Rounding Note  11/22/2015, 9:48 AM  LOS: 2 days      [facility] Attending   I have taken an interval history, reviewed the chart and examined the patient. I agree with the Advanced Practitioner's note, impression and recommendations.   She passed a stone and her biliary pancreatitis is the main illness now. She does not have cholangitis but sequelae of pancreatitis. Supportive care and empiric antibiotics reasonable at this time. Has a component of ileus also. Trying dulcolax.  Explained to patient and father - he is a PA.  Gail Booparl E. Larwence Tu, MD, [facility] [phone] (pager) [phone] after 5 PM, weekends and holidays  11/22/2015 4:22 PM     ASSESMENT:   *  Acute biliary pancreatitis.evident pancreatitis on MRCP, no fluid collections or necrosis. Lipase improved for past 2 days.  Leukocytosis persists.  On zosyn.     *  Dilated CBD, suspected CBD stone.  MRCP does not show retained stone or dilated CBD. Pt likely passed the CBD stone.  LFTs improved x past 2 days.     PLAN   *  No need for ERCP.  Surgery following, pt will need lap chole at their discretion.  Allow clears and keep LR at 150/hour.      Gail George  11/22/2015, 9:48 AM Pager: [phone]  SUBJECTIVE:   Chief complaint: abdominal pain.      Abdominal pain in upper abdomen much improved.  Bloating and slight discomfort in lower abdomen now.  No BMs.  Not much flatus.  No N/V.   Wants aggressive IVF as it increases her milk production  OBJECTIVE:         Vital signs in last 24 hours:    Temp:  [98.7 F (37.1 C)-100.4 F (38 C)] 100.4 F (38 C) (10/12 0856) Pulse Rate:  [101-110] 101 (10/12 0856) Resp:  [16] 16 (10/12 0856) BP: (98-112)/(60-71) 112/71 (10/12 0856) SpO2:  [94 %-100 %] 95 % (10/12 0856) Last BM Date: 11/19/15 Filed Weights   11/20/15 1345  Weight: 90.7 kg (199 lb 15.7 oz)   General: pleasant,  looks well.  comfortable   Heart: Regular, slightly tachy Chest: clear bil.  No dyspnea or cough Abdomen: soft, slight tenderness in upper and lower abdomen  Extremities: no CCE.  Alert and fully oriented Neuro/Psych:  Pleasant, cooperative.  No gross weakness, tremor or deficits.  Calm.    Intake/Output from previous day: 10/11 0701 - 10/12 0700 In: 3030 [I.V.:2880; IV Piggyback:150] Out: -   Lab Results:  Recent Labs  11/20/15 0929 11/21/15 0433 11/22/15 0613  WBC 22.2* 17.5* 17.5*  HGB 14.4 14.8 12.4  HCT 44.2 45.4 38.7  PLT 262 263 206   BMET  Recent Labs  11/20/15 0929 11/21/15 0433 11/22/15 0613  NA 139 138 141  K 4.1 4.0 3.7  CL 108 105 106  CO2 21* 24 29  GLUCOSE 133* 101* 103*  BUN 8 12 9   CREATININE 0.74 0.79 0.83  CALCIUM 9.5 8.8* 8.2*   LFT  Recent Labs  11/20/15 0929 11/21/15 0433 11/22/15 0613  PROT 7.1 6.1* 5.1*  ALBUMIN 3.9 3.2* 2.5*  AST 352* 107* 36  ALT 650* 380* 174*  ALKPHOS 246* 190* 118  BILITOT 5.8* 2.8* 2.0*    Studies/Results: Mr 3d Recon At Scanner Mr Abd W/wo Cm/mrcp  Result Date: 11/22/2015 CLINICAL DATA:  Patient with right upper quadrant pain. Previous ultrasound suggesting dilated common bile  duct. EXAM: MRI ABDOMEN WITHOUT AND WITH CONTRAST (INCLUDING MRCP) TECHNIQUE: Multiplanar multisequence MR imaging of the abdomen was performed both before and after the administration of intravenous contrast. Heavily T2-weighted images of the biliary and pancreatic ducts were obtained, and three-dimensional MRCP images were rendered by post processing. CONTRAST:  17 mm MultiHance COMPARISON:  Right upper quadrant ultrasound 11/20/2015. FINDINGS: Lower chest: Unremarkable Hepatobiliary: Liver is normal in size and contour. No focal hepatic lesion is identified. Gallbladder is unremarkable. Possible small stone within the gallbladder lumen. No intrahepatic or extrahepatic biliary ductal dilatation. No filling defect identified within the  common bile duct. Pancreas: The pancreatic head and uncinate process are enlarged and edematous. There is a marked amount of surrounding soft tissue stranding and fluid. Mild edema involving the pancreatic body and tail. No evidence for pancreatic necrosis. Spleen:  Unremarkable Adrenals/Urinary Tract: Normal adrenal glands. Kidneys enhance symmetrically with contrast. No hydronephrosis. Stomach/Bowel: No abnormal bowel wall thickening or evidence for bowel obstruction. Normal morphology of the stomach. Mild likely reactive wall thickening of the duodenum. Vascular/Lymphatic: Normal caliber abdominal aorta. No retroperitoneal lymphadenopathy. Other: Moderate amount of free fluid and mesenteric fat stranding predominately within the central upper abdomen right upper quadrant near the porta hepatis. Musculoskeletal: No aggressive or acute appearing osseous lesions. IMPRESSION: The pancreatic head and uncinate process are edematous and enlarged with surrounding free fluid most compatible with acute pancreatitis. No evidence for necrosis at this time. The common bile duct is normal in diameter without evidence of filling defect. Pancreatitis may potentially be secondary to a recently passed stone however no stone is identified within the common bile duct at this time. Possible small stone within the gallbladder lumen. Electronically Signed   By: Annia Belt M.D.   On: 11/22/2015 08:53   US Abdomen Limited  Result Date: 11/20/2015 CLINICAL DATA:  Choledocholithiasis.  Six weeks postpartum. EXAM: US ABDOMEN LIMITED - RIGHT UPPER QUADRANT COMPARISON:  11/18/2015 FINDINGS: Gallbladder: Mild to moderate distention of the gallbladder without gallbladder without wall thickening. Reportedly, the patient does not have a sonographic Murphy's sign. There is at least 1 small echogenic structure in the gallbladder measuring 0.5 cm. Common bile duct: Diameter: Measures up to 1.0 cm and stable. A biliary stone is not confidently  identified but the distal common bile duct is difficult to evaluate. Liver: No focal lesion identified. Normal echogenicity in the liver. There is mild to moderate intrahepatic biliary dilatation. IMPRESSION: Intrahepatic and extrahepatic biliary dilatation. Common bile duct is stable in size from the previous examination, measuring up to 1.0 cm. Stone is not confidently identified on this examination but limited evaluation of the distal common bile duct. Findings remain concerning for a distal common bile duct stone. This could be better characterized with MRCP. Mild to moderate distention of the gallbladder without wall thickening. There is at least one small gallstone. Electronically Signed   By: Richarda Overlie M.D.   On: 11/20/2015 16:09

## 2015-11-23 DIAGNOSIS — N390 Urinary tract infection, site not specified: Secondary | ICD-10-CM

## 2015-11-23 DIAGNOSIS — B9689 Other specified bacterial agents as the cause of diseases classified elsewhere: Secondary | ICD-10-CM

## 2015-11-23 DIAGNOSIS — R000 Tachycardia, unspecified: Secondary | ICD-10-CM

## 2015-11-23 LAB — COMPREHENSIVE METABOLIC PANEL
ALBUMIN: 2.3 g/dL — AB (ref 3.5–5.0)
ALK PHOS: 87 U/L (ref 38–126)
ALT: 111 U/L — ABNORMAL HIGH (ref 14–54)
AST: 25 U/L (ref 15–41)
Anion gap: 8 (ref 5–15)
BILIRUBIN TOTAL: 1.4 mg/dL — AB (ref 0.3–1.2)
BUN: 6 mg/dL (ref 6–20)
CALCIUM: 7.9 mg/dL — AB (ref 8.9–10.3)
CO2: 26 mmol/L (ref 22–32)
CREATININE: 0.63 mg/dL (ref 0.44–1.00)
Chloride: 102 mmol/L (ref 101–111)
GFR calc Af Amer: >60 mL/min (ref >60)
GLUCOSE: 106 mg/dL — AB (ref 65–99)
Potassium: 3.3 mmol/L — ABNORMAL LOW (ref 3.5–5.1)
Sodium: 136 mmol/L (ref 135–145)
TOTAL PROTEIN: 5 g/dL — AB (ref 6.5–8.1)

## 2015-11-23 LAB — CBC
HEMATOCRIT: 33.4 % — AB (ref 36.0–46.0)
HEMOGLOBIN: 10.8 g/dL — AB (ref 12.0–15.0)
MCH: 29.1 pg (ref 26.0–34.0)
MCHC: 32.3 g/dL (ref 30.0–36.0)
MCV: 90 fL (ref 78.0–100.0)
Platelets: 194 10*3/uL (ref 150–400)
RBC: 3.71 MIL/uL — ABNORMAL LOW (ref 3.87–5.11)
RDW: 12.7 % (ref 11.5–15.5)
WBC: 15 10*3/uL — AB (ref 4.0–10.5)

## 2015-11-23 LAB — URINE CULTURE: Culture: NO GROWTH

## 2015-11-23 MED ORDER — POTASSIUM CHLORIDE 10 MEQ/50ML IV SOLN
10.0000 meq | INTRAVENOUS | Status: DC | PRN
  Filled 2015-11-23: qty 50

## 2015-11-23 MED ORDER — POTASSIUM CHLORIDE 10 MEQ/100ML IV SOLN
10.0000 meq | INTRAVENOUS | Status: AC
  Administered 2015-11-23 (×3): 10 meq via INTRAVENOUS
  Filled 2015-11-23: qty 100

## 2015-11-23 MED ORDER — FLUCONAZOLE 100 MG PO TABS
150.0000 mg | ORAL_TABLET | Freq: Once | ORAL | Status: AC
  Administered 2015-11-23: 150 mg via ORAL
  Filled 2015-11-23: qty 2

## 2015-11-23 MED ORDER — POTASSIUM CHLORIDE CRYS ER 20 MEQ PO TBCR
20.0000 meq | EXTENDED_RELEASE_TABLET | Freq: Once | ORAL | Status: AC
  Administered 2015-11-23: 20 meq via ORAL
  Filled 2015-11-23: qty 1

## 2015-11-23 MED ORDER — CIPROFLOXACIN IN D5W 400 MG/200ML IV SOLN
400.0000 mg | Freq: Two times a day (BID) | INTRAVENOUS | Status: DC
  Administered 2015-11-23 – 2015-11-24 (×2): 400 mg via INTRAVENOUS
  Filled 2015-11-23 (×2): qty 200

## 2015-11-23 MED ORDER — METRONIDAZOLE 500 MG PO TABS
500.0000 mg | ORAL_TABLET | Freq: Three times a day (TID) | ORAL | Status: DC
  Administered 2015-11-23 – 2015-11-24 (×3): 500 mg via ORAL
  Filled 2015-11-23 (×3): qty 1

## 2015-11-23 MED ORDER — METRONIDAZOLE IN NACL 5-0.79 MG/ML-% IV SOLN: 500.0000 mg | Freq: Three times a day (TID) | INTRAVENOUS | Status: DC

## 2015-11-23 MED ORDER — PANTOPRAZOLE SODIUM 40 MG PO TBEC
40.0000 mg | DELAYED_RELEASE_TABLET | Freq: Every day | ORAL | Status: DC
  Administered 2015-11-23 – 2015-11-25 (×2): 40 mg via ORAL
  Filled 2015-11-23 (×2): qty 1

## 2015-11-23 NOTE — Progress Notes (Signed)
Patient states that IV potassium and IV fluids hurt too much, IV potassium was stopped. Half bag remains. MD notified. Per MD order, fluids reduced, IV potassium stopped.

## 2015-11-23 NOTE — Progress Notes (Signed)
2 Days Post-Op  Subjective: Feels a little better. Still has some epigastric pain. Low grade temps. Some dysuria. UA positive last night C/o some wheezing Objective: Vital signs in last 24 hours: Temp:  [98.6 F (37 C)-102.1 F (38.9 C)] 100 F (37.8 C) (10/13 0500) Pulse Rate:  [101-124] 124 (10/13 0500) Resp:  [16-18] 18 (10/13 0500) BP: (101-116)/(53-72) 116/66 (10/13 0500) SpO2:  [94 %-100 %] 98 % (10/13 0500) Last BM Date: 11/22/15  Intake/Output from previous day: No intake/output data recorded. Intake/Output this shift: No intake/output data recorded.  Alert, nontoxic, looks better today cta b/l upper and lower Soft, nd, +TTP in epigastrium  Lab Results:   Recent Labs  11/22/15 0613 11/23/15 0553  WBC 17.5* 15.0*  HGB 12.4 10.8*  HCT 38.7 33.4*  PLT 206 194   BMET  Recent Labs  11/22/15 1528 11/23/15 0553  NA 138 136  K 3.8 3.3*  CL 106 102  CO2 26 26  GLUCOSE 89 106*  BUN 9 6  CREATININE 0.66 0.63  CALCIUM 8.0* 7.9*   PT/INR No results for input(s): LABPROT, INR in the last 72 hours. ABG No results for input(s): PHART, HCO3 in the last 72 hours.  Invalid input(s): PCO2, PO2  Studies/Results: Mr 3d Recon At Scanner  Result Date: 11/22/2015 CLINICAL DATA:  Patient with right upper quadrant pain. Previous ultrasound suggesting dilated common bile duct. EXAM: MRI ABDOMEN WITHOUT AND WITH CONTRAST (INCLUDING MRCP) TECHNIQUE: Multiplanar multisequence MR imaging of the abdomen was performed both before and after the administration of intravenous contrast. Heavily T2-weighted images of the biliary and pancreatic ducts were obtained, and three-dimensional MRCP images were rendered by post processing. CONTRAST:  17 mm MultiHance COMPARISON:  Right upper quadrant ultrasound 11/20/2015. FINDINGS: Lower chest: Unremarkable Hepatobiliary: Liver is normal in size and contour. No focal hepatic lesion is identified. Gallbladder is unremarkable. Possible small  stone within the gallbladder lumen. No intrahepatic or extrahepatic biliary ductal dilatation. No filling defect identified within the common bile duct. Pancreas: The pancreatic head and uncinate process are enlarged and edematous. There is a marked amount of surrounding soft tissue stranding and fluid. Mild edema involving the pancreatic body and tail. No evidence for pancreatic necrosis. Spleen:  Unremarkable Adrenals/Urinary Tract: Normal adrenal glands. Kidneys enhance symmetrically with contrast. No hydronephrosis. Stomach/Bowel: No abnormal bowel wall thickening or evidence for bowel obstruction. Normal morphology of the stomach. Mild likely reactive wall thickening of the duodenum. Vascular/Lymphatic: Normal caliber abdominal aorta. No retroperitoneal lymphadenopathy. Other: Moderate amount of free fluid and mesenteric fat stranding predominately within the central upper abdomen right upper quadrant near the porta hepatis. Musculoskeletal: No aggressive or acute appearing osseous lesions. IMPRESSION: The pancreatic head and uncinate process are edematous and enlarged with surrounding free fluid most compatible with acute pancreatitis. No evidence for necrosis at this time. The common bile duct is normal in diameter without evidence of filling defect. Pancreatitis may potentially be secondary to a recently passed stone however no stone is identified within the common bile duct at this time. Possible small stone within the gallbladder lumen. Electronically Signed   By: Annia Beltrew  Davis M.D.   On: 11/22/2015 08:53   Mr Roe Coombsbd W/wo Cm/mrcp  Result Date: 11/22/2015 CLINICAL DATA:  Patient with right upper quadrant pain. Previous ultrasound suggesting dilated common bile duct. EXAM: MRI ABDOMEN WITHOUT AND WITH CONTRAST (INCLUDING MRCP) TECHNIQUE: Multiplanar multisequence MR imaging of the abdomen was performed both before and after the administration of intravenous contrast. Heavily T2-weighted images  of the biliary  and pancreatic ducts were obtained, and three-dimensional MRCP images were rendered by post processing. CONTRAST:  17 mm MultiHance COMPARISON:  Right upper quadrant ultrasound 11/20/2015. FINDINGS: Lower chest: Unremarkable Hepatobiliary: Liver is normal in size and contour. No focal hepatic lesion is identified. Gallbladder is unremarkable. Possible small stone within the gallbladder lumen. No intrahepatic or extrahepatic biliary ductal dilatation. No filling defect identified within the common bile duct. Pancreas: The pancreatic head and uncinate process are enlarged and edematous. There is a marked amount of surrounding soft tissue stranding and fluid. Mild edema involving the pancreatic body and tail. No evidence for pancreatic necrosis. Spleen:  Unremarkable Adrenals/Urinary Tract: Normal adrenal glands. Kidneys enhance symmetrically with contrast. No hydronephrosis. Stomach/Bowel: No abnormal bowel wall thickening or evidence for bowel obstruction. Normal morphology of the stomach. Mild likely reactive wall thickening of the duodenum. Vascular/Lymphatic: Normal caliber abdominal aorta. No retroperitoneal lymphadenopathy. Other: Moderate amount of free fluid and mesenteric fat stranding predominately within the central upper abdomen right upper quadrant near the porta hepatis. Musculoskeletal: No aggressive or acute appearing osseous lesions. IMPRESSION: The pancreatic head and uncinate process are edematous and enlarged with surrounding free fluid most compatible with acute pancreatitis. No evidence for necrosis at this time. The common bile duct is normal in diameter without evidence of filling defect. Pancreatitis may potentially be secondary to a recently passed stone however no stone is identified within the common bile duct at this time. Possible small stone within the gallbladder lumen. Electronically Signed   By: Annia Belt M.D.   On: 11/22/2015 08:53    Anti-infectives: Anti-infectives    Start      Dose/Rate Route Frequency Ordered Stop   11/23/15 0800  fluconazole (DIFLUCAN) tablet 150 mg     150 mg Oral  Once 11/23/15 0754     11/20/15 1800  piperacillin-tazobactam (ZOSYN) IVPB 3.375 g     3.375 g 12.5 mL/hr over 240 Minutes Intravenous Every 8 hours 11/20/15 1039     11/20/15 1045  piperacillin-tazobactam (ZOSYN) IVPB 3.375 g     3.375 g 100 mL/hr over 30 Minutes Intravenous  Once 11/20/15 1039 11/20/15 1120      Assessment/Plan: Gallstone pancreatitis UTI  Still with low grade temps, tachycardia, and epigastric TTP. I believe her pancreatitis is slowly getting better but not ready for choelcystectomy today. I believe she will be ready tomorrow. Npo p mn. Can have clears today  Pulm toilet, IS abx per primary team - doesn't need for GB Requested diflucan to prevent yeast infection - 'get one every time i'm on abx'  Bay Wayson M. Andrey Campanile, MD, FACS General, Bariatric, & Minimally Invasive Surgery [facility], [place]   LOS: 3 days    Atilano Ina 11/23/2015

## 2015-11-23 NOTE — Progress Notes (Signed)
Pharmacy Antibiotic Note  Gail George is a 30 y.o. female admitted on 11/20/2015 with cholangitis.  Pharmacy has been consulted for zosyn dosing. Patient continues to have low-grade fevers and fluconazole was added. WBC is trending down. UA from yesterday reveals many bacteria, moderate leukocytes, positive nitrite and 6-30 WBC. Per surgery note, cholecystectomy pushed until tomorrow likely.  Plan: Zosyn 3.375g IV q8h (4 hour infusion).  Monitor culture data, clinical course and LOT  Height: 5\' 9"  (175.3 cm) Weight: 199 lb 15.7 oz (90.7 kg) IBW/kg (Calculated) : 66.2  Temp (24hrs), Avg:100.3 F (37.9 C), Min:98.6 F (37 C), Max:102.1 F (38.9 C)   Recent Labs Lab 11/18/15 1650 11/20/15 0929 11/20/15 0935 11/21/15 0433 11/22/15 0613 11/22/15 1528 11/23/15 0553  WBC 14.6* 22.2*  --  17.5* 17.5*  --  15.0*  CREATININE 0.58 0.74  --  0.79 0.83 0.66 0.63  LATICACIDVEN  --   --  0.70  --   --   --   --     Estimated Creatinine Clearance: 126.7 mL/min (by C-G formula based on SCr of 0.63 mg/dL).    No Known Allergies  Arlean Hoppingorey M. Newman PiesBall, PharmD, BCPS Clinical Pharmacist Pager [phone] 11/23/2015 10:51 AM

## 2015-11-23 NOTE — Progress Notes (Signed)
Note Dr Tawana ScaleWilson's assessment today: although pancreatitis and clinical/lab picture improved, he wishes to delay lap chole due to ongoing low grade temps, positive U/A, wheezing.  GI available prn.   Jennye MoccasinSarah Talani Brazee PA-C [phone].

## 2015-11-23 NOTE — Progress Notes (Signed)
Subjective: Patient was feeling better this morning when seen during rounds. She complains of mild epigastric discomfort and generalized mild abdominal pain. Denies any specific urinary symptoms. Still having low-grade fever. Denies any nausea and vomiting. Tolerating clear liquids very well. She is going for her cholecystectomy tomorrow. She states that she is having some vaginal bleeding, as she is 6 weeks postpartum. Blood in her urine was most probably due to her vaginal bleeding.  Objective:  Vital signs in last 24 hours: Vitals:   11/22/15 1801 11/22/15 2100 11/23/15 0100 11/23/15 0500  BP: 110/61 (!) 101/53 107/69 116/66  Pulse: (!) 117 (!) 106 (!) 113 (!) 124  Resp: 18 18 18 18   Temp: 98.6 F (37 C) 100.2 F (37.9 C) (!) 100.8 F (38.2 C) 100 F (37.8 C)  TempSrc: Oral Oral Oral Oral  SpO2: 97% 100% 97% 98%  Weight:      Height:       Gen.well-developed, well-nourished lady, in no acute distress. Eyes and skin.No scleral icterus, Abdomen. Soft, mildly diffuse tenderness all over her abdomen ,Bowel sounds present. Lungs. Clear bilaterally CV.Regular rate and rhythm Extremities.No edema, no cyanosis, pulses 2+ bilaterally.  Labs. CMP Latest Ref Rng & Units 11/23/2015 11/22/2015 11/22/2015  Glucose 65 - 99 mg/dL 643(P106(H) 89 295(J103(H)  BUN 6 - 20 mg/dL 6 9 9   Creatinine 0.44 - 1.00 mg/dL 8.840.63 1.660.66 0.630.83  Sodium 135 - 145 mmol/L 136 138 141  Potassium 3.5 - 5.1 mmol/L 3.3(L) 3.8 3.7  Chloride 101 - 111 mmol/L 102 106 106  CO2 22 - 32 mmol/L 26 26 29   Calcium 8.9 - 10.3 mg/dL 7.9(L) 8.0(L) 8.2(L)  Total Protein 6.5 - 8.1 g/dL 5.0(L) 5.1(L) 5.1(L)  Total Bilirubin 0.3 - 1.2 mg/dL 0.1(S1.4(H) 2.1(H) 2.0(H)  Alkaline Phos 38 - 126 U/L 87 101 118  AST 15 - 41 U/L 25 38 36  ALT 14 - 54 U/L 111(H) 146(H) 174(H)   CBC Latest Ref Rng & Units 11/23/2015 11/22/2015 11/21/2015  WBC 4.0 - 10.5 K/uL 15.0(H) 17.5(H) 17.5(H)  Hemoglobin 12.0 - 15.0 g/dL 10.8(L) 12.4 14.8  Hematocrit  36.0 - 46.0 % 33.4(L) 38.7 45.4  Platelets 150 - 400 K/uL 194 206 263   Urinalysis    Component Value Date/Time   COLORURINE RED (A) 11/22/2015 1604   APPEARANCEUR TURBID (A) 11/22/2015 1604   LABSPEC 1.044 (H) 11/22/2015 1604   PHURINE 6.0 11/22/2015 1604   GLUCOSEU NEGATIVE 11/22/2015 1604   HGBUR LARGE (A) 11/22/2015 1604   BILIRUBINUR MODERATE (A) 11/22/2015 1604   KETONESUR 15 (A) 11/22/2015 1604   PROTEINUR 100 (A) 11/22/2015 1604   NITRITE POSITIVE (A) 11/22/2015 1604   LEUKOCYTESUR MODERATE (A) 11/22/2015 1604   Urine microscopic-add on  Order: [id number]  Status:  Final result Visible to patient:  No (Not Released) Next appt:  None    Ref Range & Units 1d ago 3d ago 5d ago   Squamous Epithelial / LPF NONE SEEN 0-5   6-30   0-5     WBC, UA 0 - 5 WBC/hpf 6-30  6-30  0-5    RBC / HPF 0 - 5 RBC/hpf TOO NUMEROUS...  0-5  NONE SEEN   TOO NUMEROUS TO COUNT   Bacteria, UA NONE SEEN MANY   FEW   MANY     Urine-Other  MUCOUS PRESENT   MUCOUS PRESENT         Assessment/Plan:  Gail George is a 30 y. o female,With past medical  history of PCO S and hypothyroidism , 6 weeks postpartum,vaginal delivery of her 2nd child on 8/26. presented to the ED with complaint of worsening right upper quadrant pain accompanied with nausea and vomiting for 1 day.  Cholangitis.Her liver enzymes are trending down. she is still having mild abdominal discomfort. She again spiked a fever up to 102 yesterday. Blood culture and urinary exams was done. Given 1 dose of Tylenol, later switched to Motrin for any more fever. -Start her on clear liquids, and proceed very slowly according to her response. -Continue Fluid resuscitation, eitherwith IV normal saline orRinger's lactate. -Continue Dilaudid 2 mg every 4 hourly if needed for pain.  UTI. Her UA done yesterday after spiking a fever shows many leukocytes and bacteria, nitrate positive with many red blood cells. Blood in her urine is most  probably from her vaginal bleeding. She has no specific urinary complaints. We will change her antibiotic from Zosyn to Cipro and Flagyl. We'll follow the culture sensitivity results.  Choledocholithiasis with obstruction. Her MRCP doesn't show any stone in the biliary duct, there were possible small stones within the gallbladder lumen. She is scheduled to have her laparoscopic cholecystectomy done tomorrow.  Acute pancreatitis. Her lipase is trending down , We will continue with fluid resuscitation. Her MRCP is positive for acute pancreatitis, does not show any sign of necrosis. Her calcium was 7.9, with corrected calcium of 9.3. -Monitor her calcium level. --Start her on clear liquids, and proceed very slowly according to her response.  Dispo: Anticipated discharge in approximately 1-2 day(s).   Arnetha Courser, MD 11/23/2015, 12:51 PM Pager: [phone]

## 2015-11-23 NOTE — Care Management Note (Signed)
Case Management Note  Patient Details  Name: Gail George MRN: [id number] Date of Birth: [birth year]  Subjective/Objective:                    Action/Plan: Plan is for cholecystectomy tomorrow. CM following for d/c needs.  Expected Discharge Date:                  Expected Discharge Plan:  Home/Self Care  In-House Referral:     Discharge planning Services     Post Acute Care Choice:    Choice offered to:     DME Arranged:    DME Agency:     HH Arranged:    HH Agency:     Status of Service:  In process, will continue to follow  If discussed at Long Length of Stay Meetings, dates discussed:    Additional Comments:  Kermit BaloKelli F Brynden Thune, RN 11/23/2015, 3:52 PM

## 2015-11-24 ENCOUNTER — Inpatient Hospital Stay (HOSPITAL_COMMUNITY): Admitting: Anesthesiology

## 2015-11-24 ENCOUNTER — Inpatient Hospital Stay (HOSPITAL_COMMUNITY)

## 2015-11-24 ENCOUNTER — Encounter (HOSPITAL_COMMUNITY)
Admission: EM | Disposition: A | Discharge status: Home / Self Care | Payer: Self-pay | Source: Home / Self Care | Attending: Internal Medicine

## 2015-11-24 ENCOUNTER — Encounter (HOSPITAL_COMMUNITY): Payer: Self-pay | Admitting: Anesthesiology

## 2015-11-24 DIAGNOSIS — Z419 Encounter for procedure for purposes other than remedying health state, unspecified: Secondary | ICD-10-CM

## 2015-11-24 HISTORY — PX: CHOLECYSTECTOMY: SHX55

## 2015-11-24 LAB — COMPREHENSIVE METABOLIC PANEL
ALBUMIN: 2.4 g/dL — AB (ref 3.5–5.0)
ALK PHOS: 87 U/L (ref 38–126)
ALT: 93 U/L — AB (ref 14–54)
ANION GAP: 5 (ref 5–15)
AST: 21 U/L (ref 15–41)
BILIRUBIN TOTAL: 1.5 mg/dL — AB (ref 0.3–1.2)
BUN: <5 mg/dL — ABNORMAL LOW (ref 6–20)
CALCIUM: 8.4 mg/dL — AB (ref 8.9–10.3)
CO2: 29 mmol/L (ref 22–32)
CREATININE: 0.59 mg/dL (ref 0.44–1.00)
Chloride: 104 mmol/L (ref 101–111)
GFR calc Af Amer: >60 mL/min (ref >60)
GFR calc non Af Amer: >60 mL/min (ref >60)
GLUCOSE: 90 mg/dL (ref 65–99)
Potassium: 3.6 mmol/L (ref 3.5–5.1)
SODIUM: 138 mmol/L (ref 135–145)
TOTAL PROTEIN: 5.3 g/dL — AB (ref 6.5–8.1)

## 2015-11-24 LAB — CBC
HEMATOCRIT: 34 % — AB (ref 36.0–46.0)
HEMOGLOBIN: 11 g/dL — AB (ref 12.0–15.0)
MCH: 28.8 pg (ref 26.0–34.0)
MCHC: 32.4 g/dL (ref 30.0–36.0)
MCV: 89 fL (ref 78.0–100.0)
Platelets: 207 10*3/uL (ref 150–400)
RBC: 3.82 MIL/uL — AB (ref 3.87–5.11)
RDW: 12.5 % (ref 11.5–15.5)
WBC: 12.3 10*3/uL — ABNORMAL HIGH (ref 4.0–10.5)

## 2015-11-24 LAB — I-STAT BETA HCG BLOOD, ED (NOT ORDERABLE): I-stat hCG, quantitative: <5 m[IU]/mL (ref <5)

## 2015-11-24 SURGERY — LAPAROSCOPIC CHOLECYSTECTOMY WITH INTRAOPERATIVE CHOLANGIOGRAM
Anesthesia: General | Site: Abdomen

## 2015-11-24 MED ORDER — PROPOFOL 10 MG/ML IV BOLUS
INTRAVENOUS | Status: DC | PRN
  Administered 2015-11-24: 120 mg via INTRAVENOUS

## 2015-11-24 MED ORDER — BUPIVACAINE-EPINEPHRINE 0.25% -1:200000 IJ SOLN
INTRAMUSCULAR | Status: AC
  Filled 2015-11-24: qty 1

## 2015-11-24 MED ORDER — SODIUM CHLORIDE 0.9 % IR SOLN
Status: DC | PRN
  Administered 2015-11-24: 1000 mL

## 2015-11-24 MED ORDER — BUPIVACAINE-EPINEPHRINE 0.25% -1:200000 IJ SOLN
INTRAMUSCULAR | Status: DC | PRN
  Administered 2015-11-24: 20 mL
  Administered 2015-11-24: 10 mL

## 2015-11-24 MED ORDER — DEXAMETHASONE SODIUM PHOSPHATE 10 MG/ML IJ SOLN
INTRAMUSCULAR | Status: AC
  Filled 2015-11-24: qty 1

## 2015-11-24 MED ORDER — SODIUM CHLORIDE 0.9 % IV SOLN
INTRAVENOUS | Status: DC | PRN
  Administered 2015-11-24: 5 mL
  Administered 2015-11-24: 100 mL

## 2015-11-24 MED ORDER — PROMETHAZINE HCL 25 MG/ML IJ SOLN
INTRAMUSCULAR | Status: AC
  Administered 2015-11-24: 6.25 mg via INTRAVENOUS
  Filled 2015-11-24: qty 1

## 2015-11-24 MED ORDER — ONDANSETRON HCL 4 MG/2ML IJ SOLN
INTRAMUSCULAR | Status: DC | PRN
  Administered 2015-11-24: 4 mg via INTRAVENOUS

## 2015-11-24 MED ORDER — LIDOCAINE 2% (20 MG/ML) 5 ML SYRINGE
INTRAMUSCULAR | Status: AC
  Filled 2015-11-24: qty 5

## 2015-11-24 MED ORDER — PROPOFOL 10 MG/ML IV BOLUS
INTRAVENOUS | Status: AC
  Filled 2015-11-24: qty 20

## 2015-11-24 MED ORDER — MIDAZOLAM HCL 2 MG/2ML IJ SOLN
INTRAMUSCULAR | Status: AC
  Filled 2015-11-24: qty 2

## 2015-11-24 MED ORDER — GLYCOPYRROLATE 0.2 MG/ML IJ SOLN
INTRAMUSCULAR | Status: DC | PRN
  Administered 2015-11-24: .6 mg via INTRAVENOUS

## 2015-11-24 MED ORDER — CHLORHEXIDINE GLUCONATE CLOTH 2 % EX PADS: 6.0000 | MEDICATED_PAD | Freq: Every day | CUTANEOUS | Status: DC

## 2015-11-24 MED ORDER — 0.9 % SODIUM CHLORIDE (POUR BTL) OPTIME
TOPICAL | Status: DC | PRN
  Administered 2015-11-24: 1000 mL

## 2015-11-24 MED ORDER — DEXAMETHASONE SODIUM PHOSPHATE 10 MG/ML IJ SOLN
INTRAMUSCULAR | Status: DC | PRN
  Administered 2015-11-24: 10 mg via INTRAVENOUS

## 2015-11-24 MED ORDER — LIDOCAINE HCL (CARDIAC) 20 MG/ML IV SOLN
INTRAVENOUS | Status: DC | PRN
  Administered 2015-11-24: 100 mg via INTRAVENOUS

## 2015-11-24 MED ORDER — DIPHENHYDRAMINE HCL 12.5 MG/5ML PO ELIX: 12.5000 mg | ORAL_SOLUTION | Freq: Four times a day (QID) | ORAL | Status: DC | PRN

## 2015-11-24 MED ORDER — CEFAZOLIN SODIUM-DEXTROSE 2-3 GM-% IV SOLR
INTRAVENOUS | Status: DC | PRN
  Administered 2015-11-24: 2 g via INTRAVENOUS

## 2015-11-24 MED ORDER — SCOPOLAMINE 1 MG/3DAYS TD PT72
MEDICATED_PATCH | TRANSDERMAL | Status: DC | PRN
  Administered 2015-11-24: 1 via TRANSDERMAL

## 2015-11-24 MED ORDER — CEFAZOLIN SODIUM-DEXTROSE 2-4 GM/100ML-% IV SOLN
INTRAVENOUS | Status: AC
  Filled 2015-11-24: qty 100

## 2015-11-24 MED ORDER — HYDROMORPHONE HCL 1 MG/ML IJ SOLN
0.2500 mg | INTRAMUSCULAR | Status: DC | PRN
  Administered 2015-11-24 (×2): 0.5 mg via INTRAVENOUS

## 2015-11-24 MED ORDER — HYDROMORPHONE HCL 1 MG/ML IJ SOLN
INTRAMUSCULAR | Status: AC
  Administered 2015-11-24: 0.5 mg via INTRAVENOUS
  Filled 2015-11-24: qty 1

## 2015-11-24 MED ORDER — PROMETHAZINE HCL 25 MG/ML IJ SOLN
6.2500 mg | INTRAMUSCULAR | Status: AC | PRN
  Administered 2015-11-24 (×2): 6.25 mg via INTRAVENOUS

## 2015-11-24 MED ORDER — LACTATED RINGERS IV SOLN
INTRAVENOUS | Status: DC
Start: 2015-11-24 — End: 2015-11-24
  Administered 2015-11-24: 10:00:00 via INTRAVENOUS

## 2015-11-24 MED ORDER — PROPOFOL 500 MG/50ML IV EMUL
INTRAVENOUS | Status: DC | PRN
  Administered 2015-11-24: 25 ug/kg/min via INTRAVENOUS

## 2015-11-24 MED ORDER — ROCURONIUM BROMIDE 10 MG/ML (PF) SYRINGE
PREFILLED_SYRINGE | INTRAVENOUS | Status: AC
  Filled 2015-11-24: qty 10

## 2015-11-24 MED ORDER — SCOPOLAMINE 1 MG/3DAYS TD PT72
MEDICATED_PATCH | TRANSDERMAL | Status: AC
  Filled 2015-11-24: qty 1

## 2015-11-24 MED ORDER — FENTANYL CITRATE (PF) 100 MCG/2ML IJ SOLN
INTRAMUSCULAR | Status: AC
  Administered 2015-11-24: 50 ug via INTRAVENOUS
  Filled 2015-11-24: qty 2

## 2015-11-24 MED ORDER — KETOROLAC TROMETHAMINE 30 MG/ML IJ SOLN
30.0000 mg | Freq: Four times a day (QID) | INTRAMUSCULAR | Status: DC | PRN
  Administered 2015-11-25: 30 mg via INTRAVENOUS
  Filled 2015-11-24: qty 1

## 2015-11-24 MED ORDER — ROCURONIUM BROMIDE 100 MG/10ML IV SOLN
INTRAVENOUS | Status: DC | PRN
  Administered 2015-11-24: 20 mg via INTRAVENOUS
  Administered 2015-11-24: 50 mg via INTRAVENOUS
  Administered 2015-11-24: 10 mg via INTRAVENOUS

## 2015-11-24 MED ORDER — KETOROLAC TROMETHAMINE 30 MG/ML IJ SOLN
INTRAMUSCULAR | Status: AC
  Filled 2015-11-24: qty 1

## 2015-11-24 MED ORDER — FENTANYL CITRATE (PF) 100 MCG/2ML IJ SOLN
INTRAMUSCULAR | Status: AC
  Filled 2015-11-24: qty 2

## 2015-11-24 MED ORDER — ENOXAPARIN SODIUM 40 MG/0.4ML ~~LOC~~ SOLN
40.0000 mg | SUBCUTANEOUS | Status: DC
  Administered 2015-11-25: 40 mg via SUBCUTANEOUS
  Filled 2015-11-24: qty 0.4

## 2015-11-24 MED ORDER — ONDANSETRON HCL 4 MG/2ML IJ SOLN
INTRAMUSCULAR | Status: AC
  Filled 2015-11-24: qty 2

## 2015-11-24 MED ORDER — NEOSTIGMINE METHYLSULFATE 5 MG/5ML IV SOSY
PREFILLED_SYRINGE | INTRAVENOUS | Status: AC
  Filled 2015-11-24: qty 5

## 2015-11-24 MED ORDER — NEOSTIGMINE METHYLSULFATE 10 MG/10ML IV SOLN
INTRAVENOUS | Status: DC | PRN
  Administered 2015-11-24: 4 mg via INTRAVENOUS

## 2015-11-24 MED ORDER — GLYCOPYRROLATE 0.2 MG/ML IV SOSY
PREFILLED_SYRINGE | INTRAVENOUS | Status: AC
  Filled 2015-11-24: qty 3

## 2015-11-24 MED ORDER — FENTANYL CITRATE (PF) 100 MCG/2ML IJ SOLN
25.0000 ug | INTRAMUSCULAR | Status: DC | PRN
  Administered 2015-11-24 (×2): 50 ug via INTRAVENOUS

## 2015-11-24 MED ORDER — IOPAMIDOL (ISOVUE-300) INJECTION 61%
INTRAVENOUS | Status: AC
  Filled 2015-11-24: qty 50

## 2015-11-24 MED ORDER — DIPHENHYDRAMINE HCL 50 MG/ML IJ SOLN: 12.5000 mg | Freq: Four times a day (QID) | INTRAMUSCULAR | Status: DC | PRN

## 2015-11-24 MED ORDER — KETOROLAC TROMETHAMINE 30 MG/ML IJ SOLN
30.0000 mg | Freq: Once | INTRAMUSCULAR | Status: AC
  Administered 2015-11-24: 30 mg via INTRAVENOUS

## 2015-11-24 MED ORDER — OXYCODONE HCL 5 MG PO TABS
5.0000 mg | ORAL_TABLET | ORAL | Status: DC | PRN
  Administered 2015-11-24 – 2015-11-25 (×5): 10 mg via ORAL
  Filled 2015-11-24 (×5): qty 2

## 2015-11-24 MED ORDER — FENTANYL CITRATE (PF) 100 MCG/2ML IJ SOLN
INTRAMUSCULAR | Status: DC | PRN
  Administered 2015-11-24: 100 ug via INTRAVENOUS
  Administered 2015-11-24 (×2): 50 ug via INTRAVENOUS

## 2015-11-24 MED ORDER — CEFAZOLIN (ANCEF) 1 G IV SOLR
2.0000 g | INTRAVENOUS | Status: DC
  Filled 2015-11-24: qty 2

## 2015-11-24 MED ORDER — MORPHINE SULFATE (PF) 2 MG/ML IV SOLN
1.0000 mg | INTRAVENOUS | Status: DC | PRN
  Administered 2015-11-24 – 2015-11-25 (×2): 2 mg via INTRAVENOUS
  Filled 2015-11-24 (×2): qty 1

## 2015-11-24 SURGICAL SUPPLY — 53 items
APL SKNCLS STERI-STRIP NONHPOA (GAUZE/BANDAGES/DRESSINGS) ×1
APPLIER CLIP 5 13 M/L LIGAMAX5 (MISCELLANEOUS) ×3
APR CLP MED LRG 5 ANG JAW (MISCELLANEOUS) ×1
BAG SPEC RTRVL 10 TROC 200 (ENDOMECHANICALS) ×1
BANDAGE ADH SHEER 1  50/CT (GAUZE/BANDAGES/DRESSINGS) ×5 IMPLANT
BENZOIN TINCTURE PRP APPL 2/3 (GAUZE/BANDAGES/DRESSINGS) ×3 IMPLANT
BLADE SURG ROTATE 9660 (MISCELLANEOUS) IMPLANT
CANISTER SUCTION 2500CC (MISCELLANEOUS) ×3 IMPLANT
CHLORAPREP W/TINT 26ML (MISCELLANEOUS) ×3 IMPLANT
CLIP APPLIE 5 13 M/L LIGAMAX5 (MISCELLANEOUS) ×1 IMPLANT
CLOSURE WOUND 1/2 X4 (GAUZE/BANDAGES/DRESSINGS) ×2
COVER MAYO STAND STRL (DRAPES) ×3 IMPLANT
COVER SURGICAL LIGHT HANDLE (MISCELLANEOUS) ×3 IMPLANT
DRAPE C-ARM 42X72 X-RAY (DRAPES) ×5 IMPLANT
DRSG TEGADERM 2-3/8X2-3/4 SM (GAUZE/BANDAGES/DRESSINGS) ×2 IMPLANT
DRSG TEGADERM 4X4.75 (GAUZE/BANDAGES/DRESSINGS) ×3 IMPLANT
ELECT REM PT RETURN 9FT ADLT (ELECTROSURGICAL) ×3
ELECTRODE REM PT RTRN 9FT ADLT (ELECTROSURGICAL) ×1 IMPLANT
ENDOLOOP SUT PDS II  0 18 (SUTURE) ×2
ENDOLOOP SUT PDS II 0 18 (SUTURE) IMPLANT
GAUZE SPONGE 2X2 8PLY STRL LF (GAUZE/BANDAGES/DRESSINGS) ×1 IMPLANT
GLOVE BIOGEL M STRL SZ7.5 (GLOVE) ×3 IMPLANT
GLOVE BIOGEL PI IND STRL 8 (GLOVE) ×1 IMPLANT
GLOVE BIOGEL PI INDICATOR 8 (GLOVE) ×2
GOWN STRL REUS W/ TWL LRG LVL3 (GOWN DISPOSABLE) ×3 IMPLANT
GOWN STRL REUS W/TWL 2XL LVL3 (GOWN DISPOSABLE) ×3 IMPLANT
GOWN STRL REUS W/TWL LRG LVL3 (GOWN DISPOSABLE) ×9
GRASPER SUT TROCAR 14GX15 (MISCELLANEOUS) IMPLANT
KIT BASIN OR (CUSTOM PROCEDURE TRAY) ×3 IMPLANT
KIT ROOM TURNOVER OR (KITS) ×3 IMPLANT
L-HOOK LAP DISP 36CM (ELECTROSURGICAL) ×3
LHOOK LAP DISP 36CM (ELECTROSURGICAL) IMPLANT
NS IRRIG 1000ML POUR BTL (IV SOLUTION) ×3 IMPLANT
PAD ARMBOARD 7.5X6 YLW CONV (MISCELLANEOUS) ×3 IMPLANT
POUCH RETRIEVAL ECOSAC 10 (ENDOMECHANICALS) ×1 IMPLANT
POUCH RETRIEVAL ECOSAC 10MM (ENDOMECHANICALS) ×2
SCISSORS LAP 5X35 DISP (ENDOMECHANICALS) ×3 IMPLANT
SET CHOLANGIOGRAPH 5 50 .035 (SET/KITS/TRAYS/PACK) ×5 IMPLANT
SET IRRIG TUBING LAPAROSCOPIC (IRRIGATION / IRRIGATOR) ×3 IMPLANT
SLEEVE ENDOPATH XCEL 5M (ENDOMECHANICALS) ×6 IMPLANT
SPECIMEN JAR SMALL (MISCELLANEOUS) ×3 IMPLANT
SPONGE GAUZE 2X2 STER 10/PKG (GAUZE/BANDAGES/DRESSINGS) ×2
STRIP CLOSURE SKIN 1/2X4 (GAUZE/BANDAGES/DRESSINGS) ×3 IMPLANT
SUT MNCRL AB 4-0 PS2 18 (SUTURE) ×3 IMPLANT
SUT NOVA 1 T20/GS 25DT (SUTURE) ×2 IMPLANT
SUT VIC AB 3-0 SH 18 (SUTURE) ×2 IMPLANT
SUT VICRYL 0 UR6 27IN ABS (SUTURE) IMPLANT
TOWEL OR 17X24 6PK STRL BLUE (TOWEL DISPOSABLE) ×3 IMPLANT
TOWEL OR 17X26 10 PK STRL BLUE (TOWEL DISPOSABLE) ×3 IMPLANT
TRAY LAPAROSCOPIC MC (CUSTOM PROCEDURE TRAY) ×3 IMPLANT
TROCAR XCEL BLUNT TIP 100MML (ENDOMECHANICALS) ×3 IMPLANT
TROCAR XCEL NON-BLD 5MMX100MML (ENDOMECHANICALS) ×3 IMPLANT
TUBING INSUFFLATION (TUBING) ×3 IMPLANT

## 2015-11-24 NOTE — Op Note (Signed)
Gail George [id number] 1985/12/14 11/24/2015  Laparoscopic Cholecystectomy with IOC with open primary repair of umbilical hernia Procedure Note  Indications: This patient presents with symptomatic gallbladder disease and will undergo laparoscopic cholecystectomy. She presented with gallstone pancreatitis. She was medically managed and her pancreatitis was improving and it was felt that she was safe for cholecystectomy. She also had a known small umbilical hernia. Since we had to use the fascial defect at the umbilicus anyway to do laparoscopic cholecystectomy we will fix the fascial defect at the end of the procedure without mesh due to this being a clean contaminated procedure. Please see medical record for discussion regarding risk and benefits of surgery  Pre-operative Diagnosis: gallstone pancreatitis, umbilical hernia  Post-operative Diagnosis: Same  Surgeon: Atilano Ina   Assistants: none  Anesthesia: General endotracheal anesthesia  Procedure Details  The patient was seen again in the Holding Room. The risks, benefits, complications, treatment options, and expected outcomes were discussed with the patient. The possibilities of reaction to medication, pulmonary aspiration, perforation of viscus, bleeding, recurrent infection, finding a normal gallbladder, the need for additional procedures, failure to diagnose a condition, the possible need to convert to an open procedure, and creating a complication requiring transfusion or operation were discussed with the patient. The likelihood of improving the patient's symptoms with return to their baseline status is good.  The patient and/or family concurred with the proposed plan, giving informed consent. The site of surgery properly noted. The patient was taken to Operating Room, identified as Gail George and the procedure verified as Laparoscopic Cholecystectomy with Intraoperative Cholangiogram. A Time Out was held and the above information  confirmed. Antibiotic prophylaxis was administered.   Prior to the induction of general anesthesia, antibiotic prophylaxis was administered. General endotracheal anesthesia was then administered and tolerated well. After the induction, the abdomen was prepped with Chloraprep and draped in the sterile fashion. The patient was positioned in the supine position.  Local anesthetic agent was injected into the skin near the umbilicus. A curvilinear infraumbilical incision was created. Dissection was carried down to the fascial defect. The umbilical stalk was lifted up and away. Fascial defect was about 0.2 cm in size. Intact fascia was identified circumferentially around the defect. Skin and soft tissue was mobilized from the surface of the fascia in a circumferential manner.  A pursestring suture of 0-Vicryl was placed around the fascial opening.  The Hasson cannula was inserted and secured with the stay suture.  Pneumoperitoneum was then created with CO2 and tolerated well without any adverse changes in the patient's vital signs. An 5-mm port was placed in the subxiphoid position.  Two 5-mm ports were placed in the right upper quadrant. All skin incisions were infiltrated with a local anesthetic agent before making the incision and placing the trocars.   We positioned the patient in reverse Trendelenburg, tilted slightly to the patient's left.  The gallbladder was identified, the fundus grasped and retracted cephalad. Adhesions were lysed bluntly and with the electrocautery where indicated, taking care not to injure any adjacent organs or viscus. There is some edema down near the pancreas and triangle of calot. The infundibulum was grasped and retracted laterally, exposing the peritoneum overlying the triangle of Calot. This was then divided and exposed in a blunt fashion. A critical view of the cystic duct and cystic artery was obtained.  The cystic duct was clearly identified and bluntly dissected  circumferentially. The cystic duct was edematous and dilated The cystic duct was ligated with a clip  distally.   An incision was made in the cystic duct and the [facility] catheter introduced. The catheter was secured using a clip. A cholangiogram was then obtained which showed good visualization of the distal and proximal biliary tree with no sign of filling defects or obstruction.  Contrast flowed easily into the duodenum. The catheter was then removed.   The cystic duct was then ligated with clip and divided. Since it was dilated I elected to place a PDS Endoloop around the cystic duct stump for additional precaution. The cystic artery which had been identified & dissected free was ligated with clips and divided as well.   The gallbladder was dissected from the liver bed in retrograde fashion with the electrocautery. The gallbladder was removed and placed in an Ecco sac.  The gallbladder and Ecco sac were then removed through the umbilical port site. The liver bed was irrigated and inspected. Hemostasis was achieved with the electrocautery. Copious irrigation was utilized and was repeatedly aspirated until clear.    Pneumoperitoneum was released and the [facility] trocar was removed.   The umbilical fascia was then closed primarily with 5 interrupted 1-0novafil sutures. The cavity was irrigated and additional local was infiltrated in the subcutaneous tissue and fascia.  We again inspected the right upper quadrant for hemostasis.  The umbilical closure was inspected and there was no air leak and nothing trapped within the closure. Pneumoperitoneum was released as we removed the trocars. The umbilical stalk was then tacked back down to the fascia with two 3-0 vicryl sutures. 4-0 Monocryl was used to close the skin.   Benzoin, steri-strips, and clean dressings were applied. The patient was then extubated and brought to the recovery room in stable condition. Instrument, sponge, and needle counts were  correct at closure and at the conclusion of the case.      Findings: 2 cm fascial defect at the umbilicus. It was closed primarily. Normal IOC.  Estimated Blood Loss: Minimal         Drains: none         Specimens: Gallbladder           Complications: None; patient tolerated the procedure well.         Disposition: PACU - hemodynamically stable.         Condition: stable  Mary SellaEric M. Andrey CampanileWilson, MD, FACS General, Bariatric, & Minimally Invasive Surgery [facility] [place]

## 2015-11-24 NOTE — Progress Notes (Signed)
  Subjective: Still with some abd pain but better. No emesis  Objective: Vital signs in last 24 hours: Temp:  [99.3 F (37.4 C)-100.5 F (38.1 C)] 99.4 F (37.4 C) (10/14 0500) Pulse Rate:  [103-115] 106 (10/14 0500) Resp:  [18-20] 18 (10/14 0500) BP: (100-117)/(57-70) 103/68 (10/14 0500) SpO2:  [93 %-97 %] 93 % (10/14 0500) Last BM Date: 11/21/15  Intake/Output from previous day: No intake/output data recorded. Intake/Output this shift: No intake/output data recorded.  Alert, nontoxic cta Mild tachy Soft, nd, min TTP. Small umbilical hernia  Lab Results:   Recent Labs  11/23/15 0553 11/24/15 0300  WBC 15.0* 12.3*  HGB 10.8* 11.0*  HCT 33.4* 34.0*  PLT 194 207   BMET  Recent Labs  11/23/15 0553 11/24/15 0300  NA 136 138  K 3.3* 3.6  CL 102 104  CO2 26 29  GLUCOSE 106* 90  BUN 6 <5*  CREATININE 0.63 0.59  CALCIUM 7.9* 8.4*   PT/INR No results for input(s): LABPROT, INR in the last 72 hours. ABG No results for input(s): PHART, HCO3 in the last 72 hours.  Invalid input(s): PCO2, PO2  Studies/Results: No results found.  Anti-infectives: Anti-infectives    Start     Dose/Rate Route Frequency Ordered Stop   11/23/15 1400  metroNIDAZOLE (FLAGYL) tablet 500 mg     500 mg Oral Every 8 hours 11/23/15 1148     11/23/15 1200  ciprofloxacin (CIPRO) IVPB 400 mg     400 mg 200 mL/hr over 60 Minutes Intravenous Every 12 hours 11/23/15 1137     11/23/15 1145  metroNIDAZOLE (FLAGYL) IVPB 500 mg  Status:  Discontinued     500 mg 100 mL/hr over 60 Minutes Intravenous Every 8 hours 11/23/15 1137 11/23/15 1148   11/23/15 0830  fluconazole (DIFLUCAN) tablet 150 mg     150 mg Oral  Once 11/23/15 0754 11/23/15 1017   11/20/15 1800  piperacillin-tazobactam (ZOSYN) IVPB 3.375 g  Status:  Discontinued     3.375 g 12.5 mL/hr over 240 Minutes Intravenous Every 8 hours 11/20/15 1039 11/23/15 1137   11/20/15 1045  piperacillin-tazobactam (ZOSYN) IVPB 3.375 g     3.375  g 100 mL/hr over 30 Minutes Intravenous  Once 11/20/15 1039 11/20/15 1120      Assessment/Plan: Gallstone pancreatitis  I believe her abd is ready for lap chole with ioc. Will primarily repair umbilical hernia on way out -no mesh due to at least a wound class 2 procedure.   I discussed laparoscopic cholecystectomy with IOC in detail.  The patient was shown diagrams detailing the procedure.  We discussed the risks and benefits of a laparoscopic cholecystectomy including, but not limited to bleeding, infection, injury to surrounding structures such as the intestine or liver, bile leak, retained gallstones, need to convert to an open procedure, prolonged diarrhea, blood clots such as  DVT, common bile duct injury, anesthesia risks, and possible need for additional procedures.  We discussed the typical post-operative recovery course. I explained that the likelihood of improvement of their symptoms is good.  Mary SellaEric M. Andrey CampanileWilson, MD, FACS General, Bariatric, & Minimally Invasive [facility] [place]    LOS: 4 days    Atilano InaWILSON,Anay Rathe M 11/24/2015

## 2015-11-24 NOTE — Progress Notes (Signed)
Patient breastfeeding. Patient informed for 24 hours post anesthesia she would need to pump and dump per anesthesia.

## 2015-11-24 NOTE — Transfer of Care (Signed)
Immediate Anesthesia Transfer of Care Note  Patient: Gail George  Procedure(s) Performed: Procedure(s): LAPAROSCOPIC CHOLECYSTECTOMY WITH INTRAOPERATIVE CHOLANGIOGRAM (N/A)  Patient Location: PACU  Anesthesia Type:General  Level of Consciousness: awake, alert  and oriented  Airway & Oxygen Therapy: Patient Spontanous Breathing and Patient connected to nasal cannula oxygen  Post-op Assessment: Report given to RN and Post -op Vital signs reviewed and stable  Post vital signs: Reviewed and stable  Last Vitals:  Vitals:   11/24/15 0500 11/24/15 1302  BP: 103/68 (P) 120/76  Pulse: (!) 106 (!) (P) 106  Resp: 18 (P) 16  Temp: 37.4 C (P) 36.7 C    Last Pain:  Vitals:   11/24/15 0830  TempSrc:   PainSc: 3       Patients Stated Pain Goal: 3 (11/24/15 0311)  Complications: No apparent anesthesia complications

## 2015-11-24 NOTE — Progress Notes (Signed)
Patient left for surgery at this time. 

## 2015-11-24 NOTE — Progress Notes (Signed)
Patient came back from OR. Patient alert and oriented x 4. Patient denies any pain at this time. Will continue to monitor.

## 2015-11-24 NOTE — Anesthesia Procedure Notes (Signed)
Procedure Name: Intubation Date/Time: 11/24/2015 10:50 AM Performed by: Quentin OreWALKER, Karuna Balducci E Pre-anesthesia Checklist: Patient identified, Emergency Drugs available, Suction available and Patient being monitored Patient Re-evaluated:Patient Re-evaluated prior to inductionOxygen Delivery Method: Circle system utilized Preoxygenation: Pre-oxygenation with 100% oxygen Intubation Type: IV induction Ventilation: Mask ventilation without difficulty Laryngoscope Size: Mac and 3 Grade View: Grade I Tube type: Oral Tube size: 7.0 mm Number of attempts: 1 Airway Equipment and Method: Stylet Placement Confirmation: ETT inserted through vocal cords under direct vision,  positive ETCO2 and breath sounds checked- equal and bilateral Secured at: 21 cm Tube secured with: Tape Dental Injury: Teeth and Oropharynx as per pre-operative assessment

## 2015-11-24 NOTE — Progress Notes (Signed)
   Subjective: Patient was feeling better this morning. She states that she had few episodes of feeling hot yesterday. Denies any more abdominal pain, nausea or vomiting. She was tolerating clear liquids very well. She is nothing by mouth after midnight for her laparoscopic cholecystectomy today.  Objective:  Vital signs in last 24 hours: Vitals:   11/23/15 1801 11/23/15 2130 11/24/15 0100 11/24/15 0500  BP: 117/68 111/62 (!) 100/57 103/68  Pulse: (!) 115 (!) 112 (!) 103 (!) 106  Resp: 20 18 18 18   Temp: (!) 100.5 F (38.1 C) 99.9 F (37.7 C) 99.5 F (37.5 C) 99.4 F (37.4 C)  TempSrc: Oral Oral Oral Oral  SpO2: 97% 94% 95% 93%  Weight:      Height:       Gen.well-developed, well-nourished lady, in no acute distress. Eyes and skin.No scleral icterus, Abdomen. Soft, mildly diffuse tenderness all over her abdomen ,Bowel sounds present. Lungs. Clear bilaterally CV.Mildly tachycardic, regular rhythm,No r/m/g. Extremities.No edema, no cyanosis, pulses 2+ bilaterally.  Labs. CBC Latest Ref Rng & Units 11/24/2015 11/23/2015 11/22/2015  WBC 4.0 - 10.5 K/uL 12.3(H) 15.0(H) 17.5(H)  Hemoglobin 12.0 - 15.0 g/dL 11.0(L) 10.8(L) 12.4  Hematocrit 36.0 - 46.0 % 34.0(L) 33.4(L) 38.7  Platelets 150 - 400 K/uL 207 194 206   CMP Latest Ref Rng & Units 11/24/2015 11/23/2015 11/22/2015  Glucose 65 - 99 mg/dL 90 161(W106(H) 89  BUN 6 - 20 mg/dL <9(U<5(L) 6 9  Creatinine 0.450.44 - 1.00 mg/dL 4.090.59 8.110.63 9.140.66  Sodium 135 - 145 mmol/L 138 136 138  Potassium 3.5 - 5.1 mmol/L 3.6 3.3(L) 3.8  Chloride 101 - 111 mmol/L 104 102 106  CO2 22 - 32 mmol/L 29 26 26   Calcium 8.9 - 10.3 mg/dL 7.8(G8.4(L) 7.9(L) 8.0(L)  Total Protein 6.5 - 8.1 g/dL 5.3(L) 5.0(L) 5.1(L)  Total Bilirubin 0.3 - 1.2 mg/dL 9.5(A1.5(H) 2.1(H1.4(H) 2.1(H)  Alkaline Phos 38 - 126 U/L 87 87 101  AST 15 - 41 U/L 21 25 38  ALT 14 - 54 U/L 93(H) 111(H) 146(H)   Urine culture. No growth Blood culture. No growth  Assessment/Plan:  Mrs. Axel FillerCausey is a 30  y. o female,With past medical history of PCO S and hypothyroidism , 6 weeks postpartum,vaginal delivery of her 2nd child on 8/26. presented to the ED with complaint of worsening right upper quadrant pain accompanied with nausea and vomiting for 1 day.  Cholangitis/Choledocholithiasis. Her liver enzymes has normalized, except mildly elevated ALT and bilirubin. Her abdominal pain and nausea and vomiting has improved. Still having mildly elevated temperature, maximum recorded was 100.8 yesterday at 6 PM. She is nothing by mouth today after midnight with that anticipation of cholecystectomy today. Continue Fluid resuscitation. Continue Dilaudid 2 mg every 4 hourly if needed for pain.  Acute pancreatitis. Clinically improving. Continue fluid resuscitation. We will progress her diet slowly after the surgery.  UTI. Although his urine examination was looking infected, her cultures shows no growth. She has no urinary complaints . We changed her antibiotic from Zosyn to Cipro and Flagyl yesterday. -Continue Cipro and Flagyl.  Dispo: Anticipated discharge in approximately 1-2 day(s).   Arnetha CourserSumayya Tymier Lindholm, MD 11/24/2015, 8:14 AM Pager: [phone]

## 2015-11-24 NOTE — Anesthesia Preprocedure Evaluation (Addendum)
Anesthesia Evaluation  Patient identified by MRN, date of birth, ID band Patient awake    Reviewed: Allergy & Precautions, H&P , NPO status , Patient's Chart, lab work & pertinent test results  History of Anesthesia Complications Negative for: history of anesthetic complications  Airway Mallampati: I  TM Distance: >3 FB Neck ROM: full    Dental no notable dental hx. (+) Teeth Intact, Dental Advisory Given   Pulmonary neg pulmonary ROS,    Pulmonary exam normal breath sounds clear to auscultation       Cardiovascular Exercise Tolerance: Good negative cardio ROS   Rhythm:Regular Rate:Tachycardia     Neuro/Psych negative neurological ROS  negative psych ROS   GI/Hepatic negative GI ROS, Neg liver ROS,   Endo/Other  Hypothyroidism   Renal/GU negative Renal ROS     Musculoskeletal   Abdominal   Peds  Hematology  (+) Blood dyscrasia, anemia ,   Anesthesia Other Findings PCOS  Reproductive/Obstetrics PCOS                           Anesthesia Physical  Anesthesia Plan  ASA: II  Anesthesia Plan: General   Post-op Pain Management:    Induction: Intravenous, Rapid sequence and Cricoid pressure planned  Airway Management Planned: Oral ETT  Additional Equipment:   Intra-op Plan:   Post-operative Plan: Extubation in OR  Informed Consent: I have reviewed the patients History and Physical, chart, labs and discussed the procedure including the risks, benefits and alternatives for the proposed anesthesia with the patient or authorized representative who has indicated his/her understanding and acceptance.   Dental Advisory Given  Plan Discussed with: Anesthesiologist, CRNA and Surgeon  Anesthesia Plan Comments: (Risks/benefits of general anesthesia discussed with patient including risk of damage to teeth, lips, gum, and tongue, nausea/vomiting, allergic reactions to medications, and the  possibility of heart attack, stroke and death.  All patient questions answered.  Patient wishes to proceed.)       Anesthesia Quick Evaluation

## 2015-11-25 ENCOUNTER — Encounter (HOSPITAL_COMMUNITY): Payer: Self-pay | Admitting: General Surgery

## 2015-11-25 DIAGNOSIS — K8031 Calculus of bile duct with cholangitis, unspecified, with obstruction: Secondary | ICD-10-CM

## 2015-11-25 DIAGNOSIS — K8050 Calculus of bile duct without cholangitis or cholecystitis without obstruction: Secondary | ICD-10-CM

## 2015-11-25 DIAGNOSIS — K859 Acute pancreatitis without necrosis or infection, unspecified: Secondary | ICD-10-CM

## 2015-11-25 LAB — COMPREHENSIVE METABOLIC PANEL
ALK PHOS: 77 U/L (ref 38–126)
ALT: 73 U/L — ABNORMAL HIGH (ref 14–54)
ANION GAP: 7 (ref 5–15)
AST: 27 U/L (ref 15–41)
Albumin: 2.3 g/dL — ABNORMAL LOW (ref 3.5–5.0)
BILIRUBIN TOTAL: 0.8 mg/dL (ref 0.3–1.2)
BUN: 6 mg/dL (ref 6–20)
CALCIUM: 8.6 mg/dL — AB (ref 8.9–10.3)
CO2: 28 mmol/L (ref 22–32)
Chloride: 104 mmol/L (ref 101–111)
Creatinine, Ser: 0.54 mg/dL (ref 0.44–1.00)
Glucose, Bld: 111 mg/dL — ABNORMAL HIGH (ref 65–99)
POTASSIUM: 3.9 mmol/L (ref 3.5–5.1)
Sodium: 139 mmol/L (ref 135–145)
TOTAL PROTEIN: 5.4 g/dL — AB (ref 6.5–8.1)

## 2015-11-25 LAB — CULTURE, BLOOD (ROUTINE X 2)
CULTURE: NO GROWTH
Culture: NO GROWTH

## 2015-11-25 LAB — CBC
HEMATOCRIT: 31.3 % — AB (ref 36.0–46.0)
HEMOGLOBIN: 10.1 g/dL — AB (ref 12.0–15.0)
MCH: 28.4 pg (ref 26.0–34.0)
MCHC: 32.3 g/dL (ref 30.0–36.0)
MCV: 87.9 fL (ref 78.0–100.0)
Platelets: 271 10*3/uL (ref 150–400)
RBC: 3.56 MIL/uL — ABNORMAL LOW (ref 3.87–5.11)
RDW: 11.9 % (ref 11.5–15.5)
WBC: 12.5 10*3/uL — ABNORMAL HIGH (ref 4.0–10.5)

## 2015-11-25 MED ORDER — METRONIDAZOLE 500 MG PO TABS
500.0000 mg | ORAL_TABLET | Freq: Three times a day (TID) | ORAL | Status: DC
  Administered 2015-11-25: 500 mg via ORAL
  Filled 2015-11-25: qty 1

## 2015-11-25 MED ORDER — IBUPROFEN 800 MG PO TABS: 800.0000 mg | ORAL_TABLET | Freq: Three times a day (TID) | ORAL | 0 refills | Status: AC | PRN

## 2015-11-25 MED ORDER — HYDROCODONE-ACETAMINOPHEN 5-325 MG PO TABS: 1.0000 | ORAL_TABLET | Freq: Four times a day (QID) | ORAL | 0 refills | Status: DC | PRN

## 2015-11-25 MED ORDER — CIPROFLOXACIN HCL 500 MG PO TABS
500.0000 mg | ORAL_TABLET | Freq: Two times a day (BID) | ORAL | Status: DC
  Administered 2015-11-25: 500 mg via ORAL
  Filled 2015-11-25: qty 1

## 2015-11-25 MED ORDER — PANTOPRAZOLE SODIUM 40 MG PO TBEC: 40.0000 mg | DELAYED_RELEASE_TABLET | Freq: Every day | ORAL | 0 refills | Status: AC

## 2015-11-25 NOTE — Progress Notes (Signed)
   Subjective: Patient was much better this morning when seen. Complained of mild abdominal discomfort. She had her laparoscopic cholecystectomy done yesterday. She was tolerating clear liquids very well. She is being afebrile.  Objective:  Vital signs in last 24 hours: Vitals:   11/25/15 0123 11/25/15 0536 11/25/15 0600 11/25/15 1015  BP: 114/72 106/67  114/71  Pulse: 79 (!) 59 81 64  Resp: 14 16 16 16   Temp: 98 F (36.7 C) 98.1 F (36.7 C)  98.4 F (36.9 C)  TempSrc: Oral Oral  Oral  SpO2: 93% 93% 96% 97%  Weight:      Height:       Gen.well-developed, well-nourished lady, in no acute distress. Eyes and skin.Noscleral icterus, Abdomen. Soft, mildly diffuse tenderness all over her abdomen, had one paraumblical bandage with mild drainage and 3 right upper quadrant clear bandage ,Bowel sounds present. Lungs. Clear bilaterally CV.Mildly tachycardic, regular rhythm,No r/m/g. Extremities.No edema, no cyanosis, pulses 2+ bilaterally.  Labs. CMP Latest Ref Rng & Units 11/25/2015 11/24/2015 11/23/2015  Glucose 65 - 99 mg/dL 161(W111(H) 90 960(A106(H)  BUN 6 - 20 mg/dL 6 <5(W<5(L) 6  Creatinine 0.980.44 - 1.00 mg/dL 1.190.54 1.470.59 8.290.63  Sodium 135 - 145 mmol/L 139 138 136  Potassium 3.5 - 5.1 mmol/L 3.9 3.6 3.3(L)  Chloride 101 - 111 mmol/L 104 104 102  CO2 22 - 32 mmol/L 28 29 26   Calcium 8.9 - 10.3 mg/dL 5.6(O8.6(L) 1.3(Y8.4(L) 7.9(L)  Total Protein 6.5 - 8.1 g/dL 8.6(V5.4(L) 5.3(L) 5.0(L)  Total Bilirubin 0.3 - 1.2 mg/dL 0.8 7.8(I1.5(H) 6.9(G1.4(H)  Alkaline Phos 38 - 126 U/L 77 87 87  AST 15 - 41 U/L 27 21 25   ALT 14 - 54 U/L 73(H) 93(H) 111(H)   CBC Latest Ref Rng & Units 11/25/2015 11/24/2015 11/23/2015  WBC 4.0 - 10.5 K/uL 12.5(H) 12.3(H) 15.0(H)  Hemoglobin 12.0 - 15.0 g/dL 10.1(L) 11.0(L) 10.8(L)  Hematocrit 36.0 - 46.0 % 31.3(L) 34.0(L) 33.4(L)  Platelets 150 - 400 K/uL 271 207 194    Assessment/Plan:  Gail George is a 30 y. o female,With past medical history of PCO S and hypothyroidism , 6 weeks  postpartum,vaginal delivery of her 2nd child on 8/26. presented to the ED with complaint of worsening right upper quadrant pain accompanied with nausea and vomiting for 1 day.  Cholangitis/Choledocholithiasis. Her liver enzymes has normalized, except mildly elevated ALT, trending down. She had her laparoscopic cholecystectomy with normal cholangiogram yesterday. She is tolerating advancement in her diet very well.  Acute pancreatitis. Clinically improving.  UTI. Although his urine examination was looking infected, her cultures shows no growth. She has no urinary complaints . -Stop her Cipro and Flagyl.   Dispo: Being discharged today.  Gail CourserSumayya Cherysh Epperly, MD 11/25/2015, 1:17 PM Pager: [phone]

## 2015-11-25 NOTE — Progress Notes (Signed)
Patient is being d/c home. D/c instructions given and patient verbalized understanding. 

## 2015-11-25 NOTE — Progress Notes (Signed)
Progress Note: General Surgery Service   Subjective: Pain controlled, tolerating clears  Objective: Vital signs in last 24 hours: Temp:  [98 F (36.7 C)-99 F (37.2 C)] 98.1 F (36.7 C) (10/15 0536) Pulse Rate:  [59-106] 81 (10/15 0600) Resp:  [14-18] 16 (10/15 0600) BP: (106-124)/(67-83) 106/67 (10/15 0536) SpO2:  [90 %-96 %] 96 % (10/15 0600) Last BM Date: 11/21/15  Intake/Output from previous day: 10/14 0701 - 10/15 0700 In: 10152.5 [I.V.:10152.5] Out: 50 [Blood:50] Intake/Output this shift: No intake/output data recorded.  Lungs: CTAb  Cardiovascular: RRR  Abd: soft, ATTP, incisions bandage with min seep through  Extremities: no edema  Neuro: AOx4  Lab Results: CBC   Recent Labs  11/24/15 0300 11/25/15 0713  WBC 12.3* 12.5*  HGB 11.0* 10.1*  HCT 34.0* 31.3*  PLT 207 271   BMET  Recent Labs  11/24/15 0300 11/25/15 0713  NA 138 139  K 3.6 3.9  CL 104 104  CO2 29 28  GLUCOSE 90 111*  BUN <5* 6  CREATININE 0.59 0.54  CALCIUM 8.4* 8.6*   PT/INR No results for input(s): LABPROT, INR in the last 72 hours. ABG No results for input(s): PHART, HCO3 in the last 72 hours.  Invalid input(s): PCO2, PO2  Studies/Results:  Anti-infectives: Anti-infectives    Start     Dose/Rate Route Frequency Ordered Stop   11/25/15 0800  ciprofloxacin (CIPRO) tablet 500 mg  Status:  Discontinued     500 mg Oral 2 times daily 11/25/15 0703 11/25/15 0819   11/25/15 0715  metroNIDAZOLE (FLAGYL) tablet 500 mg  Status:  Discontinued     500 mg Oral Every 8 hours 11/25/15 0705 11/25/15 0816   11/24/15 1028  ceFAZolin (ANCEF) 2-4 GM/100ML-% IVPB    Comments:  Shireen Quanodd, Robert   : cabinet override      11/24/15 1028 11/24/15 2244   11/24/15 0845  ceFAZolin (ANCEF) powder 2 g  Status:  Discontinued     2 g Other To Surgery 11/24/15 0830 11/24/15 1447   11/23/15 1400  metroNIDAZOLE (FLAGYL) tablet 500 mg  Status:  Discontinued     500 mg Oral Every 8 hours 11/23/15 1148  11/24/15 1447   11/23/15 1200  ciprofloxacin (CIPRO) IVPB 400 mg  Status:  Discontinued     400 mg 200 mL/hr over 60 Minutes Intravenous Every 12 hours 11/23/15 1137 11/24/15 1447   11/23/15 1145  metroNIDAZOLE (FLAGYL) IVPB 500 mg  Status:  Discontinued     500 mg 100 mL/hr over 60 Minutes Intravenous Every 8 hours 11/23/15 1137 11/23/15 1148   11/23/15 0830  fluconazole (DIFLUCAN) tablet 150 mg     150 mg Oral  Once 11/23/15 0754 11/23/15 1017   11/20/15 1800  piperacillin-tazobactam (ZOSYN) IVPB 3.375 g  Status:  Discontinued     3.375 g 12.5 mL/hr over 240 Minutes Intravenous Every 8 hours 11/20/15 1039 11/23/15 1137   11/20/15 1045  piperacillin-tazobactam (ZOSYN) IVPB 3.375 g     3.375 g 100 mL/hr over 30 Minutes Intravenous  Once 11/20/15 1039 11/20/15 1120      Medications: Scheduled Meds: . enoxaparin (LOVENOX) injection  40 mg Subcutaneous Q24H  . pantoprazole  40 mg Oral Daily   Continuous Infusions: . dextrose 5% lactated ringers Stopped (11/25/15 0222)   PRN Meds:.diphenhydrAMINE **OR** diphenhydrAMINE, ketorolac, morphine injection, ondansetron (ZOFRAN) IV, oxyCODONE, promethazine  Assessment/Plan: Patient Active Problem List   Diagnosis Date Noted  . Surgery, elective   . Dilated bile duct   .  Choledocholithiasis with obstruction 11/20/2015  . Hypothyroidism 11/20/2015  . PCOS (polycystic ovarian syndrome) 11/20/2015  . Acute pancreatitis 11/20/2015  . Hyperglycemia 11/20/2015  . Transaminitis 11/20/2015  . Cholangitis 11/20/2015  . Hyperbilirubinemia 11/20/2015   s/p Procedure(s): LAPAROSCOPIC CHOLECYSTECTOMY WITH INTRAOPERATIVE CHOLANGIOGRAM 11/24/2015 -advance diet -ok to discharge -ambulate -f/u wilson in 3 weeks   LOS: 5 days   Rodman Pickle, MD Pg# [phone] [facility]

## 2015-11-25 NOTE — Discharge Summary (Signed)
Name: Gail George MRN: [id number] DOB: [birth year] 30 y.o. PCP: No Pcp Per Patient  Date of Admission: 11/20/2015  9:16 AM Date of Discharge: 11/26/2015 Attending Physician: No att. providers found  Discharge Diagnosis: 1. Choledocholithiasis with obstruction 2. Cholangitis 3. Pancreatitis  Discharge Medications:   Medication List    STOP taking these medications   ondansetron 4 MG tablet Commonly known as:  ZOFRAN     TAKE these medications   HYDROcodone-acetaminophen 5-325 MG tablet Commonly known as:  NORCO/VICODIN Take 1-2 tablets by mouth every 6 (six) hours as needed for moderate pain. What changed:  reasons to take this   ibuprofen 800 MG tablet Commonly known as:  ADVIL,MOTRIN Take 1 tablet (800 mg total) by mouth every 8 (eight) hours as needed.   metFORMIN 500 MG (MOD) 24 hr tablet Commonly known as:  GLUMETZA Take 500 mg by mouth every evening.   pantoprazole 40 MG tablet Commonly known as:  PROTONIX Take 1 tablet (40 mg total) by mouth daily.   prenatal multivitamin Tabs tablet Take 1 tablet by mouth daily at 12 noon.   thyroid 15 MG tablet Commonly known as:  ARMOUR Take 15 mg by mouth daily. Pt takes with 60 mg to equal 75 mg What changed:  Another medication with the same name was removed. Continue taking this medication, and follow the directions you see here.       Disposition and follow-up:   GailZyairah George was discharged from [facility] in Good condition.  At the hospital follow up visit please address:  1.  Her abdominal pain.  2.  Labs / imaging needed at time of follow-up: None  3.  Pending labs/ test needing follow-up: None  Follow-up Appointments: Follow-up Information    Atilano Ina, MD Follow up in 3 week(s).   Specialty:  General Surgery Contact information: [address] [place] [address] [phone]           Hospital Course by problem list:  [name] is a 84 y. o  female,With past medical history of PCO S and hypothyroidism , 6 weeks postpartum,vaginal delivery of her 2nd child on 8/26. presented to the ED with complaint of worsening right upper quadrant pain accompanied with nausea and vomiting for 1 day.  Cholangitis/Choledocholithiasis. Patient meets criteria for cholangitis given 2013 guidelines (reported fever, elevated WBC, jaundice, elevated LFTs and biliary dilation seen on [place]. Initially she was treated with IV Zosyn, fluid resuscitation with normal saline and Ringer lactate, IV Dilaudid and Zofran. GI was consulted, and Gen. surgery was consulted. Her symptoms start getting improving with then the day, GI decided to do MRCP for proceeding for ERCP. Her MRCP was negative for any impacted stone. Due to her gradual improvement in her symptoms, it was decided not to proceed with ERCP, instead she had her laparoscopic cholecystectomy done on October 14. Cholangiogram performed during surgery was negative for any remaining or impacted stones. After 3 to her days of IV Zosyn , her urine examination to look  infected , her antibiotic coverage was switched to Cipro IV and Flagyl .later after her surgery and urine and blood cultures on to be negative for any growth .her antibiotics were discontinued . On discharge she was able to tolerate her diet , still having mild abdominal discomfort which was anticipated due to surgery.  Acute pancreatitis.her lipase level on admission was 3802, which started trending down the very next day after fluid resuscitation she was most  probably having this pancreatitis due to obstruction.Her triglyceride level was normal. MRCP was positive for edema of pancreatic head without any necrosis.her calcium level remained stable during her stay. Her pain and nausea vomiting resolved within next few days.  Discharge Vitals:   BP 114/71 (BP Location: Right Arm)   Pulse 64   Temp 98.4 F (36.9 C) (Oral)   Resp 16   Ht 5\' 9"  (1.753 m)    Wt 199 lb 15.7 oz (90.7 kg)   LMP 12/25/2014 (Exact Date) Comment: Breastfeeding  SpO2 97%   BMI 29.53 kg/m   Gen.well-developed, well-nourished lady, in no acute distress. Eyes and skin.Noscleral icterus, Abdomen. Soft, mildly diffuse tenderness all over her abdomen, had one paraumblical bandage with mild drainage and 3 right upper quadrant clear bandage ,Bowel sounds present. Lungs. Clear bilaterally CV.Mildly tachycardic,regular rhythm,No r/m/g. Extremities.No edema, no cyanosis, pulses 2+ bilaterally.  Pertinent Labs, Studies, and Procedures:  CMP Latest Ref Rng & Units 11/25/2015 11/24/2015 11/23/2015  Glucose 65 - 99 mg/dL 101(B) 90 510(C)  BUN 6 - 20 mg/dL 6 <5(E) 6  Creatinine 5.27 - 1.00 mg/dL 7.82 4.23 5.36  Sodium 135 - 145 mmol/L 139 138 136  Potassium 3.5 - 5.1 mmol/L 3.9 3.6 3.3(L)  Chloride 101 - 111 mmol/L 104 104 102  CO2 22 - 32 mmol/L 28 29 26   Calcium 8.9 - 10.3 mg/dL 1.4(E) 3.1(V) 7.9(L)  Total Protein 6.5 - 8.1 g/dL 4.0(G) 5.3(L) 5.0(L)  Total Bilirubin 0.3 - 1.2 mg/dL 0.8 8.6(P) 6.1(P)  Alkaline Phos 38 - 126 U/L 77 87 87  AST 15 - 41 U/L 27 21 25   ALT 14 - 54 U/L 73(H) 93(H) 111(H)   CBC Latest Ref Rng & Units 11/25/2015 11/24/2015 11/23/2015  WBC 4.0 - 10.5 K/uL 12.5(H) 12.3(H) 15.0(H)  Hemoglobin 12.0 - 15.0 g/dL 10.1(L) 11.0(L) 10.8(L)  Hematocrit 36.0 - 46.0 % 31.3(L) 34.0(L) 33.4(L)  Platelets 150 - 400 K/uL 271 207 194   Lipid Panel     Component Value Date/Time   CHOL 188 11/20/2015 1509   TRIG 36 11/20/2015 1509   HDL 105 11/20/2015 1509   CHOLHDL 1.8 11/20/2015 1509   VLDL 7 11/20/2015 1509   LDLCALC 76 11/20/2015 1509   Urinalysis    Component Value Date/Time   COLORURINE RED (A) 11/22/2015 1604   APPEARANCEUR TURBID (A) 11/22/2015 1604   LABSPEC 1.044 (H) 11/22/2015 1604   PHURINE 6.0 11/22/2015 1604   GLUCOSEU NEGATIVE 11/22/2015 1604   HGBUR LARGE (A) 11/22/2015 1604   BILIRUBINUR MODERATE (A) 11/22/2015 1604    KETONESUR 15 (A) 11/22/2015 1604   PROTEINUR 100 (A) 11/22/2015 1604   NITRITE POSITIVE (A) 11/22/2015 1604   LEUKOCYTESUR MODERATE (A) 11/22/2015 1604   Urine culture . No growth   Blood culture. No growth Lipase. 3802>>>658>>>199  US abdomen. IMPRESSION: Intrahepatic and extrahepatic biliary dilatation. Common bile duct is stable in size from the previous examination, measuring up to 1.0 cm. Stone is not confidently identified on this examination but limited evaluation of the distal common bile duct. Findings remain concerning for a distal common bile duct stone. This could be better characterized with MRCP. Mild to moderate distention of the gallbladder without wall thickening. There is at least one small gallstone.  MRCP. IMPRESSION: The pancreatic head and uncinate process are edematous and enlarged with surrounding free fluid most compatible with acute pancreatitis.No evidence for necrosis at this time. The common bile duct is normal in diameter without evidence of filling defect. Pancreatitis  may potentially be secondary to a recently passed stone however no stone is identified within the common bile duct at this time.  Possible small stone within the gallbladder lumen.  DG Cholangiogram operative. IMPRESSION: Intraoperative cholangiogram demonstrates extrahepatic biliary ducts of unremarkable caliber, with no large filling defect identified. Free flow of contrast across the ampulla.  Discharge Instructions: Discharge Instructions    Call MD for:  difficulty breathing, headache or visual disturbances    Complete by:  As directed    Call MD for:  hives    Complete by:  As directed    Call MD for:  persistant nausea and vomiting    Complete by:  As directed    Call MD for:  redness, tenderness, or signs of infection (pain, swelling, redness, odor or green/yellow discharge around incision site)    Complete by:  As directed    Call MD for:  severe uncontrolled pain     Complete by:  As directed    Call MD for:  temperature >100.4    Complete by:  As directed    Diet - low sodium heart healthy    Complete by:  As directed    Diet - low sodium heart healthy    Complete by:  As directed    Discharge wound care:    Complete by:  As directed    Ok to shower tomorrow. Glue will likely peel off in 1-3 weeks. No bandage required   Driving Restrictions    Complete by:  As directed    No driving while on narcotics   Increase activity slowly    Complete by:  As directed    Increase activity slowly    Complete by:  As directed    Lifting restrictions    Complete by:  As directed    No lifting greater than 20 pounds for 3 weeks      Signed: Arnetha CourserSumayya Peder Allums, MD 11/26/2015, 3:05 PM   Pager: [phone]

## 2015-11-25 NOTE — Anesthesia Postprocedure Evaluation (Signed)
Anesthesia Post Note  Patient: Gail George  Procedure(s) Performed: Procedure(s) (LRB): LAPAROSCOPIC CHOLECYSTECTOMY WITH INTRAOPERATIVE CHOLANGIOGRAM (N/A)  Patient location during evaluation: PACU Anesthesia Type: General Level of consciousness: awake and alert Pain management: pain level controlled Vital Signs Assessment: post-procedure vital signs reviewed and stable Respiratory status: spontaneous breathing, nonlabored ventilation and respiratory function stable Cardiovascular status: blood pressure returned to baseline and stable Postop Assessment: no signs of nausea or vomiting Anesthetic complications: no    Last Vitals:  Vitals:   11/25/15 0600 11/25/15 1015  BP:  114/71  Pulse: 81 64  Resp: 16 16  Temp:  36.9 C    Last Pain:  Vitals:   11/25/15 1137  TempSrc:   PainSc: 2                  Lynnell Fiumara A

## 2015-11-25 NOTE — Progress Notes (Signed)
Patient left the unit at this time. 

## 2015-11-27 LAB — CULTURE, BLOOD (ROUTINE X 2)
Culture: NO GROWTH
Culture: NO GROWTH

## 2017-10-10 IMAGING — MR MR 3D RECON AT SCANNER
19 series · 19 of 19 positions shown · IV contrast (multihance)
Comparison: Right upper quadrant ultrasound 11/20/2015.

CLINICAL DATA: Patient with right upper quadrant pain. Previous
ultrasound suggesting dilated common bile duct.

EXAM:
MRI ABDOMEN WITHOUT AND WITH CONTRAST (INCLUDING MRCP)
TECHNIQUE: Multiplanar multisequence MR imaging of the abdomen was performed
both before and after the administration of intravenous contrast.
Heavily T2-weighted images of the biliary and pancreatic ducts were
obtained, and three-dimensional MRCP images were rendered by post
processing.
CONTRAST:  17 mm MultiHance

[Series 3: T2 fat-sat · axial · 5.0mm · 0.78mm/px · 1 of 51 slices shown]
[im 1/51]
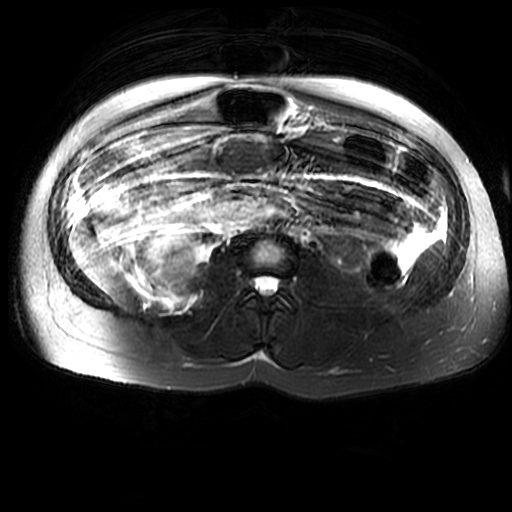

[Series 4: T2 · coronal · 5.0mm · 0.82mm/px · 1 of 46 slices shown (1 of 2)]
[im 1/46]
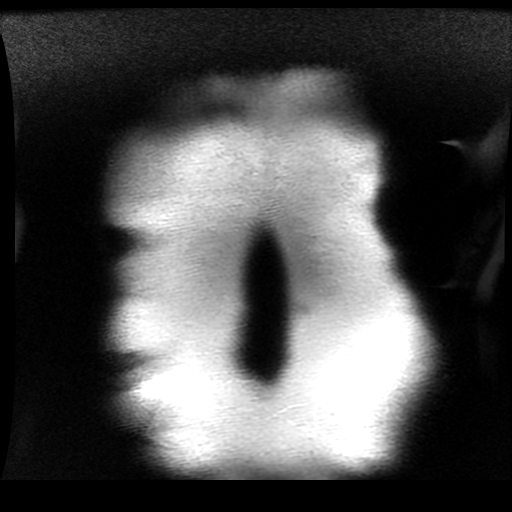

[Series 5: T2 · axial · 5.0mm · 0.78mm/px · 1 of 51 slices shown (2 of 2)]
[im 1/51]
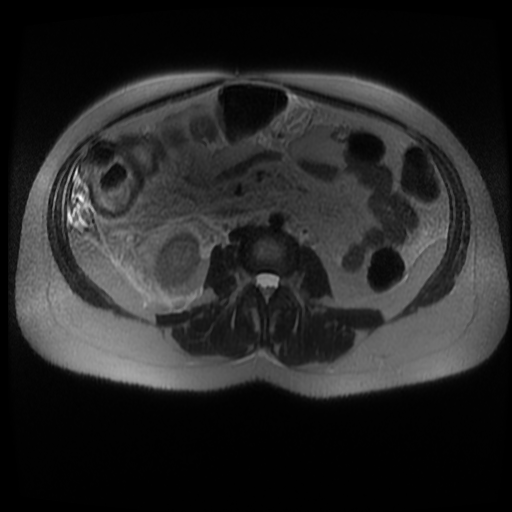

[Series 6: DWI b500 · axial · 6.0mm · 1.48mm/px · 1 of 74 slices shown]
[im 1/74]
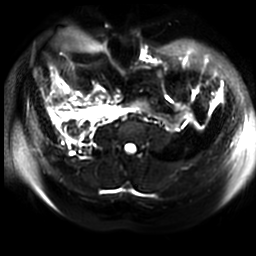

[Series 7: MRCP · coronal · 1.6mm · 0.62mm/px · 1 of 96 slices shown (1 of 3)]
[im 1/96]
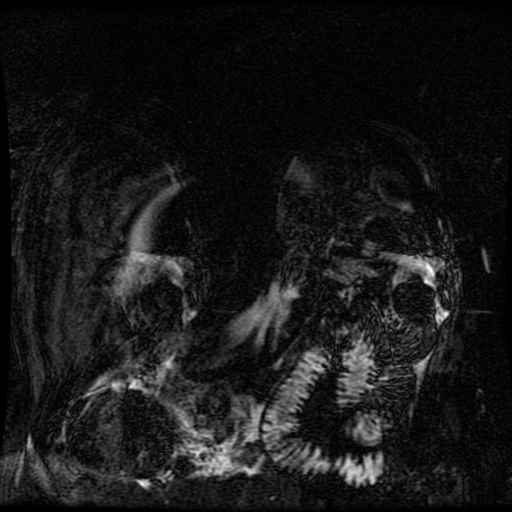

[Series 8: MRCP · coronal · 40.0mm · 0.70mm/px · 1 of 5 slices shown (2 of 3)]
[im 1/5]
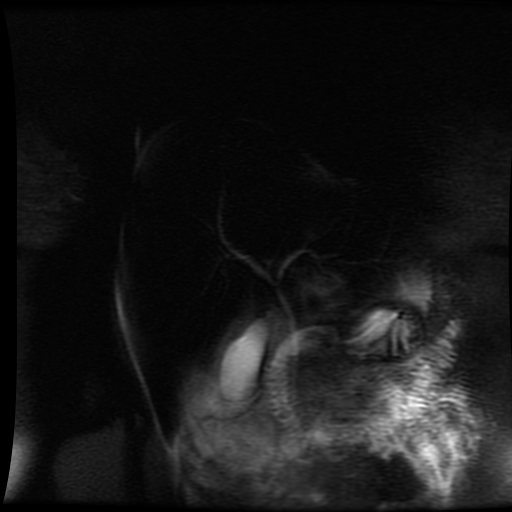

[Series 9: MRCP · coronal · 2.0mm · 0.70mm/px · 1 of 38 slices shown (3 of 3)]
[im 1/38]
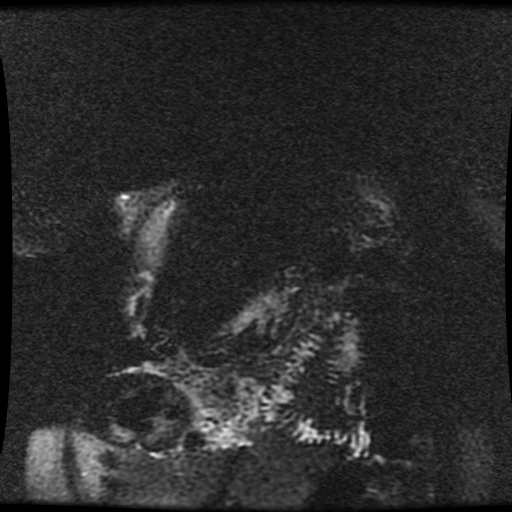

[Series 12: ax dualecho · axial · 5.0mm · 0.78mm/px · 1 of 102 slices shown]
[im 1/102]
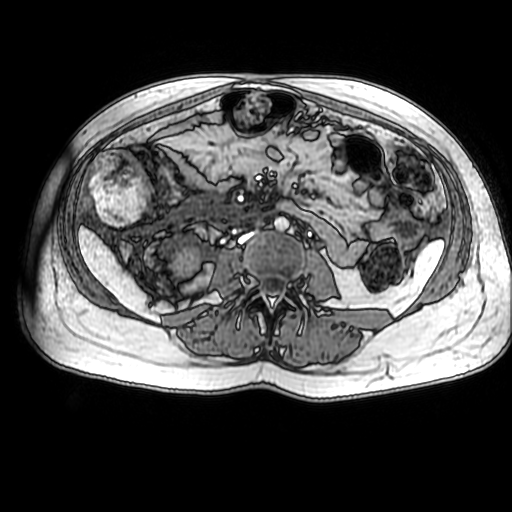

[Series 14: T1 dynamic post-contrast · coronal · 5.0mm · 0.78mm/px · 1 of 88 slices shown]
[im 1/88]
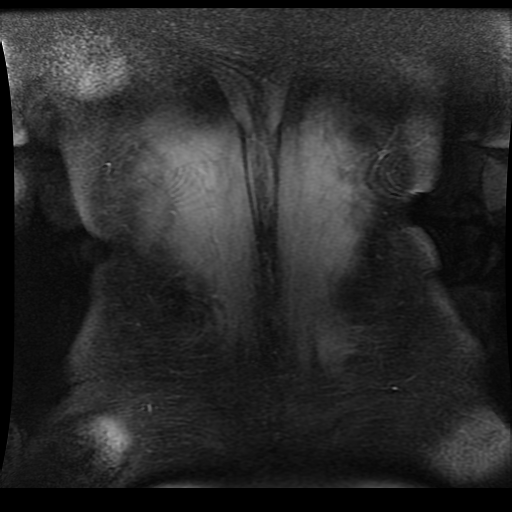

[Series 601: DWI · axial · 6.0mm · 1.48mm/px · 1 of 36 slices shown]
[im 1/36]
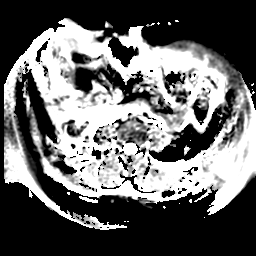

[Series 1300: T1 dynamic · axial · 5.0mm · 0.78mm/px · 1 of 96 slices shown (1 of 5)]
[im 1/96]
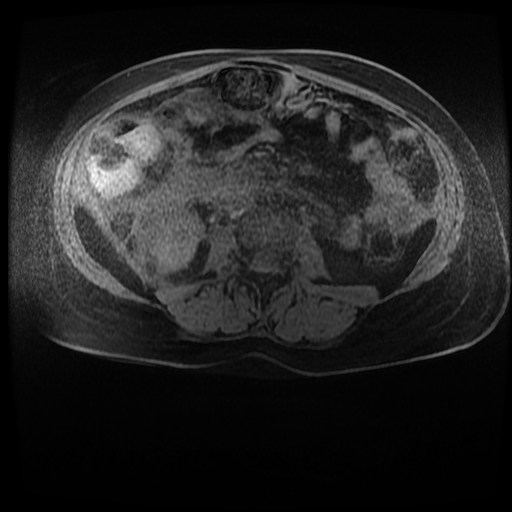

[Series 1301: T1 dynamic · axial · 5.0mm · 0.78mm/px · 1 of 96 slices shown (2 of 5)]
[im 1/96]
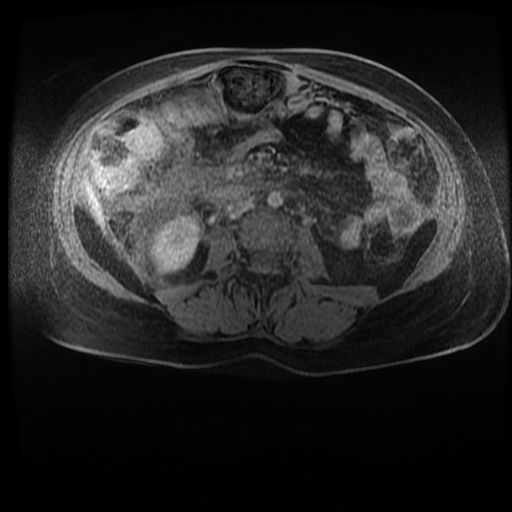

[Series 1302: T1 dynamic · axial · 5.0mm · 0.78mm/px · 1 of 96 slices shown (3 of 5)]
[im 1/96]
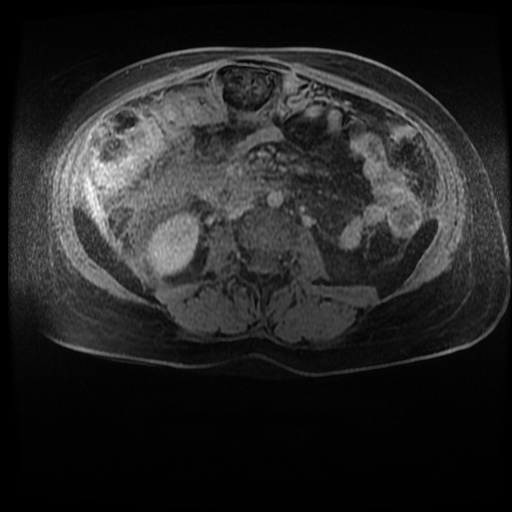

[Series 1303: T1 dynamic · axial · 5.0mm · 0.78mm/px · 1 of 96 slices shown (4 of 5)]
[im 1/96]
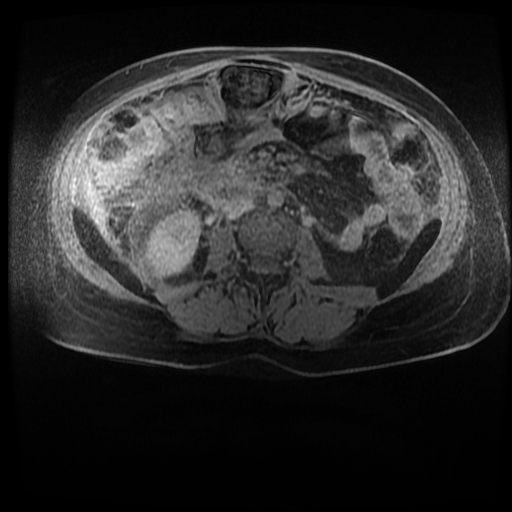

[Series 1304: T1 dynamic · axial · 5.0mm · 0.78mm/px · 1 of 96 slices shown (5 of 5)]
[im 1/96]
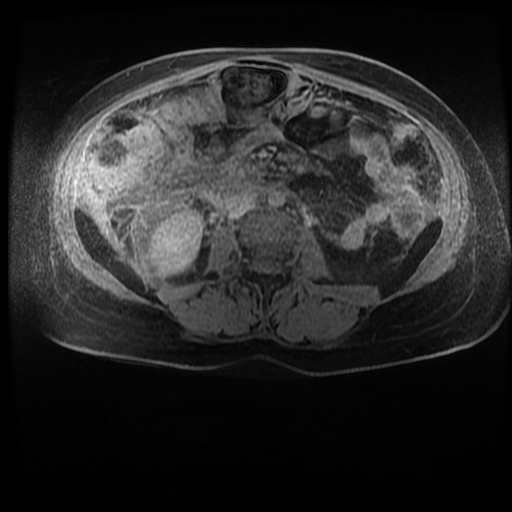

[((id)/(id)/1)-((id)/(id)/1) · axial · 5.0mm · 0.78mm/px · 1 of 96 slices shown (1 of 4)]
[im 1/96]
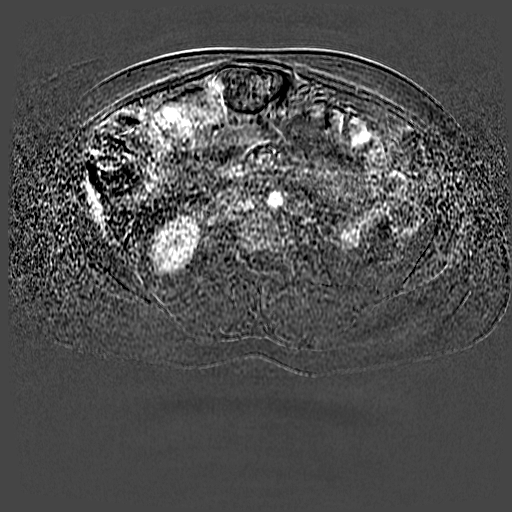

[((id)/(id)/1)-((id)/(id)/1) · axial · 5.0mm · 0.78mm/px · 1 of 96 slices shown (2 of 4)]
[im 1/96]
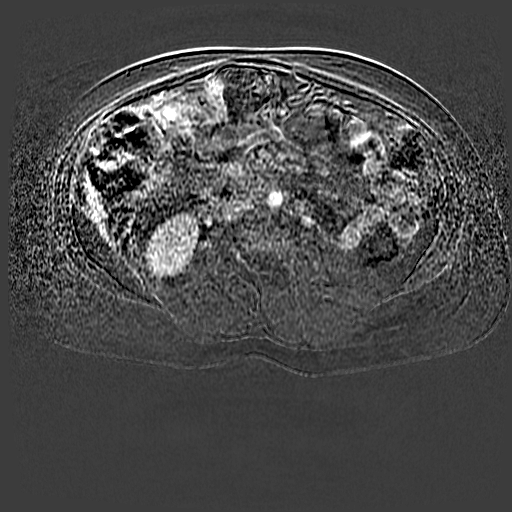

[((id)/(id)/1)-((id)/(id)/1) · axial · 5.0mm · 0.78mm/px · 1 of 96 slices shown (3 of 4)]
[im 1/96]
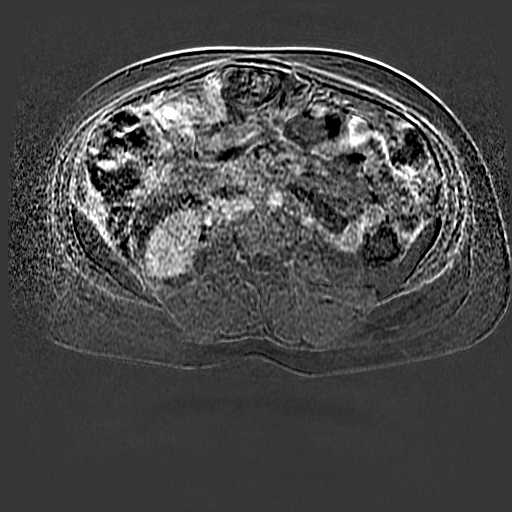

[((id)/(id)/1)-((id)/(id)/1) · axial · 5.0mm · 0.78mm/px · 1 of 96 slices shown (4 of 4)]
[im 1/96]
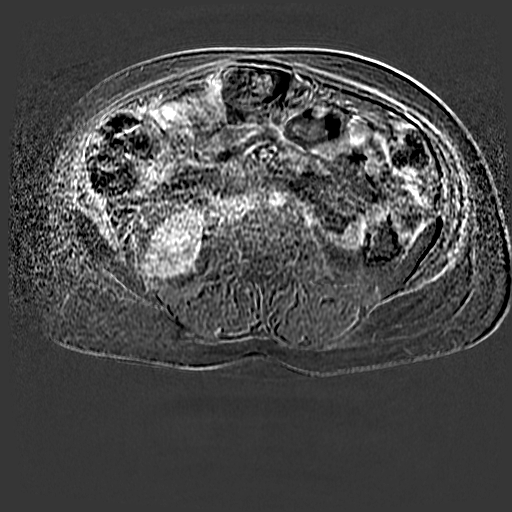

[19 of 19 positions shown; findings below may reference images not displayed]

FINDINGS: Lower chest: Unremarkable

Hepatobiliary: Liver is normal in size and contour. No focal hepatic
lesion is identified. Gallbladder is unremarkable. Possible small
stone within the gallbladder lumen. No intrahepatic or extrahepatic
biliary ductal dilatation. No filling defect identified within the
common bile duct.

Pancreas: The pancreatic head and uncinate process are enlarged and
edematous. There is a marked amount of surrounding soft tissue
stranding and fluid. Mild edema involving the pancreatic body and
tail. No evidence for pancreatic necrosis.

Spleen:  Unremarkable

Adrenals/Urinary Tract: Normal adrenal glands. Kidneys enhance
symmetrically with contrast. No hydronephrosis.

Stomach/Bowel: No abnormal bowel wall thickening or evidence for
bowel obstruction. Normal morphology of the stomach. Mild likely
reactive wall thickening of the duodenum.

Vascular/Lymphatic: Normal caliber abdominal aorta. No
retroperitoneal lymphadenopathy.

Other: Moderate amount of free fluid and mesenteric fat stranding
predominately within the central upper abdomen right upper quadrant
near the porta hepatis.

Musculoskeletal: No aggressive or acute appearing osseous lesions.
IMPRESSION: The pancreatic head and uncinate process are edematous and enlarged
with surrounding free fluid most compatible with acute pancreatitis.
No evidence for necrosis at this time.

The common bile duct is normal in diameter without evidence of
filling defect. Pancreatitis may potentially be secondary to a
recently passed stone however no stone is identified within the
common bile duct at this time.

Possible small stone within the gallbladder lumen.

## 2018-05-01 ENCOUNTER — Inpatient Hospital Stay (HOSPITAL_COMMUNITY)
Admission: AD | Admit: 2018-05-01 | Discharge: 2018-05-01 | Disposition: A | Discharge status: Home / Self Care | Source: Ambulatory Visit | Attending: Obstetrics | Admitting: Obstetrics

## 2018-05-01 ENCOUNTER — Other Ambulatory Visit: Payer: Self-pay

## 2018-05-01 ENCOUNTER — Encounter (HOSPITAL_COMMUNITY): Payer: Self-pay | Admitting: *Deleted

## 2018-05-01 DIAGNOSIS — E282 Polycystic ovarian syndrome: Secondary | ICD-10-CM | POA: Diagnosis not present

## 2018-05-01 DIAGNOSIS — D649 Anemia, unspecified: Secondary | ICD-10-CM | POA: Insufficient documentation

## 2018-05-01 DIAGNOSIS — Z7984 Long term (current) use of oral hypoglycemic drugs: Secondary | ICD-10-CM | POA: Diagnosis not present

## 2018-05-01 DIAGNOSIS — E039 Hypothyroidism, unspecified: Secondary | ICD-10-CM | POA: Diagnosis not present

## 2018-05-01 DIAGNOSIS — R55 Syncope and collapse: Secondary | ICD-10-CM | POA: Diagnosis not present

## 2018-05-01 LAB — COMPREHENSIVE METABOLIC PANEL
ALK PHOS: 76 U/L (ref 38–126)
ALT: 13 U/L (ref 0–44)
ANION GAP: 8 (ref 5–15)
AST: 20 U/L (ref 15–41)
Albumin: 2.3 g/dL — ABNORMAL LOW (ref 3.5–5.0)
BILIRUBIN TOTAL: 0.9 mg/dL (ref 0.3–1.2)
BUN: 10 mg/dL (ref 6–20)
CALCIUM: 8 mg/dL — AB (ref 8.9–10.3)
CO2: 18 mmol/L — ABNORMAL LOW (ref 22–32)
CREATININE: 0.87 mg/dL (ref 0.44–1.00)
Chloride: 107 mmol/L (ref 98–111)
GFR calc non Af Amer: >60 mL/min (ref >60)
Glucose, Bld: 115 mg/dL — ABNORMAL HIGH (ref 70–99)
Potassium: 4.6 mmol/L (ref 3.5–5.1)
SODIUM: 133 mmol/L — AB (ref 135–145)
Total Protein: 5 g/dL — ABNORMAL LOW (ref 6.5–8.1)

## 2018-05-01 LAB — CBC
HCT: 30.6 % — ABNORMAL LOW (ref 36.0–46.0)
Hemoglobin: 10 g/dL — ABNORMAL LOW (ref 12.0–15.0)
MCH: 29.6 pg (ref 26.0–34.0)
MCHC: 32.7 g/dL (ref 30.0–36.0)
MCV: 90.5 fL (ref 80.0–100.0)
PLATELETS: 235 10*3/uL (ref 150–400)
RBC: 3.38 MIL/uL — AB (ref 3.87–5.11)
RDW: 13.9 % (ref 11.5–15.5)
WBC: 21.6 10*3/uL — ABNORMAL HIGH (ref 4.0–10.5)
nRBC: 0 % (ref 0.0–0.2)

## 2018-05-01 LAB — TYPE AND SCREEN
ABO/RH(D): A POS
Antibody Screen: NEGATIVE

## 2018-05-01 LAB — TSH: TSH: 0.594 u[IU]/mL (ref 0.350–4.500)

## 2018-05-01 NOTE — MAU Provider Note (Addendum)
History    CSN: [id number] Arrival date and time: 05/01/18 4098   Chief Complaint  Patient presents with  . Loss of Consciousness   HPI Gail George is a 33yo [id number] who is PPD#0 from SVD at home around 42 weeks. States she was planning home delivery, had one with her last birth. Started having contractions yesterday then quickly transitioned shortly after midnight with delivery around 0300 this morning. States no obvious complications with delivery, mother is an Charity fundraiser was with her. States placenta delivered soon after, about 3 minutes. Has not had much bleeding since that time. States went to go get into bed after delivery and felt lightheaded, blurry vision so family helped her lay down on grown. States "went out for a couple seconds." Then, had two other similar episodes lying in bed. Felt weak, pale, heart was racing. Denies any chest pain or shortness of breath. States has checked BG and BP at home and have been normal. Had normal diet and po intake yesterday. Feels much better since getting 2L of fluids. Also very hungry and requesting to eat. Wants to be able to breastfeed baby who dad is bringing from home. Was brought in by EMS, states had final episode of syncope when they were trying to sit her up prior to transport.   History of syncope with last delivery. Family with history of vagal responses so thought it may be related to that. States takes thyroid medication, reports TSH usually low and does better with it at that level.  OB History    Gravida  5   Para  3   Term  3   Preterm      AB  2   Living  3     SAB  2   TAB      Ectopic      Multiple      Live Births  3           Past Medical History:  Diagnosis Date  . Gallstones 11/2015  . Hypothyroidism   . PCOS (polycystic ovarian syndrome)     Past Surgical History:  Procedure Laterality Date  . CHOLECYSTECTOMY N/A 11/24/2015   Procedure: LAPAROSCOPIC CHOLECYSTECTOMY WITH INTRAOPERATIVE CHOLANGIOGRAM;   Surgeon: Gaynelle Adu, MD;  Location: [facility] OR;  Service: General;  Laterality: N/A;  . WISDOM TOOTH EXTRACTION      Family History  Problem Relation Age of Onset  . Cancer Mother   . Cancer Maternal Grandfather   . Cancer Paternal Grandfather     Social History   Tobacco Use  . Smoking status: Never Smoker  . Smokeless tobacco: Never Used  Substance Use Topics  . Alcohol use: No  . Drug use: No    Allergies: No Known Allergies  Medications Prior to Admission  Medication Sig Dispense Refill Last Dose  . Prenatal Vit-Fe Fumarate-FA (PRENATAL MULTIVITAMIN) TABS tablet Take 1 tablet by mouth daily at 12 noon.   04/30/2018 at Unknown time  . thyroid (ARMOUR) 15 MG tablet Take 15 mg by mouth daily. Pt takes with 60 mg to equal 75 mg   04/30/2018 at Unknown time  . HYDROcodone-acetaminophen (NORCO/VICODIN) 5-325 MG tablet Take 1-2 tablets by mouth every 6 (six) hours as needed for moderate pain. 30 tablet 0   . ibuprofen (ADVIL,MOTRIN) 800 MG tablet Take 1 tablet (800 mg total) by mouth every 8 (eight) hours as needed. 30 tablet 0   . metFORMIN (GLUMETZA) 500 MG (MOD) 24 hr tablet Take 500 mg by  mouth every evening.    11/19/2015 at Unknown time  . pantoprazole (PROTONIX) 40 MG tablet Take 1 tablet (40 mg total) by mouth daily. 30 tablet 0     Review of Systems  Constitutional: Positive for fatigue. Negative for fever.  Eyes: Positive for visual disturbance.  Respiratory: Negative for shortness of breath.   Cardiovascular: Negative for chest pain, palpitations and leg swelling.  Gastrointestinal: Negative for nausea and vomiting.  Genitourinary: Positive for vaginal bleeding. Negative for pelvic pain.  Neurological: Positive for dizziness and light-headedness. Negative for headaches.  Psychiatric/Behavioral: The patient is not nervous/anxious.    Physical Exam   Blood pressure 100/64, pulse (!) 154, temperature 99 F (37.2 C), temperature source Oral, resp. rate 19, SpO2 98  %.  Physical Exam  Nursing note and vitals reviewed. Constitutional: She is oriented to person, place, and time. She appears well-developed and well-nourished. No distress.  HENT:  Head: Normocephalic and atraumatic.  Eyes: Conjunctivae and EOM are normal.  Cardiovascular: Regular rhythm, normal heart sounds and intact distal pulses.  No murmur heard. Tachycardic  Respiratory: Effort normal and breath sounds normal. No respiratory distress. She has no wheezes.  GI: Soft.  Appropriately tender, fundus firm just below umbilicus  Genitourinary:    Genitourinary Comments: deferred   Musculoskeletal:        General: Edema (trace bilateral) present.  Neurological: She is alert and oriented to person, place, and time. No cranial nerve deficit.  Skin: Skin is warm and dry.  Psychiatric: She has a normal mood and affect. Her behavior is normal.    MAU Course  Procedures  MDM -- normal orthostatic BP but had positional tachycardia  -- 2nd L of fluid running -- will check CBC, CMP, TSH -- EKG with sinus tachycardia (rate 120s), some LAD, QRS upper limit of normal 114  -- will give water to drink and regular diet then reevaluate   Assessment and Plan  Plan of care signed out to Sharen Counter, CNM at 8:22AM.   Tamera Stands, DO  05/01/2018, 6:45 AM   Results for orders placed or performed during the hospital encounter of 05/01/18 (from the past 24 hour(s))  CBC     Status: Abnormal   Collection Time: 05/01/18  7:18 AM  Result Value Ref Range   WBC 21.6 (H) 4.0 - 10.5 K/uL   RBC 3.38 (L) 3.87 - 5.11 MIL/uL   Hemoglobin 10.0 (L) 12.0 - 15.0 g/dL   HCT 63.8 (L) 75.6 - 43.3 %   MCV 90.5 80.0 - 100.0 fL   MCH 29.6 26.0 - 34.0 pg   MCHC 32.7 30.0 - 36.0 g/dL   RDW 29.5 18.8 - 41.6 %   Platelets 235 150 - 400 K/uL   nRBC 0.0 0.0 - 0.2 %  Type and screen [facility]     Status: None   Collection Time: 05/01/18  7:18 AM  Result Value Ref Range   ABO/RH(D) A  POS    Antibody Screen NEG    Sample Expiration      05/04/2018 Performed at [facility], [address], [place], [place] [address]   Comprehensive metabolic panel     Status: Abnormal   Collection Time: 05/01/18  7:18 AM  Result Value Ref Range   Sodium 133 (L) 135 - 145 mmol/L   Potassium 4.6 3.5 - 5.1 mmol/L   Chloride 107 98 - 111 mmol/L   CO2 18 (L) 22 - 32 mmol/L  Glucose, Bld 115 (H) 70 - 99 mg/dL   BUN 10 6 - 20 mg/dL   Creatinine, Ser 6.60 0.44 - 1.00 mg/dL   Calcium 8.0 (L) 8.9 - 10.3 mg/dL   Total Protein 5.0 (L) 6.5 - 8.1 g/dL   Albumin 2.3 (L) 3.5 - 5.0 g/dL   AST 20 15 - 41 U/L   ALT 13 0 - 44 U/L   Alkaline Phosphatase 76 38 - 126 U/L   Total Bilirubin 0.9 0.3 - 1.2 mg/dL   GFR calc non Af Amer >60 >60 mL/min   GFR calc Af Amer >60 >60 mL/min   Anion gap 8 5 - 15  TSH     Status: None   Collection Time: 05/01/18  7:18 AM  Result Value Ref Range   TSH 0.594 0.350 - 4.500 uIU/mL   MDM: Pt doing well following IV fluids and PO food.  Pt up to bathroom without any dizziness or near syncope.  Reports she feels well.  VS wnl.  Fundal exam and bleeding normal.  CBC with Hgb 10.0, electrolytes wnl, TSH wnl. Reviewed normal labs with pt, mild anemia is only abnormality. Recommend PNV, PO iron, and iron rich foods, increase PO fluids.  Pt to f/u with her gyn provider, Dr Vincente Poli, as soon as possible. Infant is well appearing, breastfeeding well in MAU. Pt has follow up plan with infant at [facility], who sees her other children. Warning signs, reasons to seek care/return to MAU reviewed.  Pt states understanding.    A: 1. Syncope, unspecified syncope type   2. Postpartum care following vaginal delivery   3. Mild anemia     P: D/C home F/U outpatient as planned  Sharen Counter, CNM 11:59 AM

## 2018-05-01 NOTE — MAU Note (Signed)
Pt presents to MAU via EMS PP vaginal home delivery with syncopal episodes. Pt delivered a viable female infant at 35. Pt then states she passed out 3x pt denies falling or hitting her head. Pt denies any other complications. Pt reports she had PNC until 20 weeks. Pt states she took her BP's and BS at home but they were always fine.

## 2018-05-02 LAB — ABO/RH: ABO/RH(D): A POS

## 2018-10-24 ENCOUNTER — Encounter: Payer: Self-pay | Admitting: Emergency Medicine

## 2018-10-24 ENCOUNTER — Ambulatory Visit
Admission: EM | Admit: 2018-10-24 | Discharge: 2018-10-24 | Disposition: A | Discharge status: Home / Self Care | Attending: Emergency Medicine | Admitting: Emergency Medicine

## 2018-10-24 ENCOUNTER — Other Ambulatory Visit: Payer: Self-pay

## 2018-10-24 DIAGNOSIS — Z1159 Encounter for screening for other viral diseases: Secondary | ICD-10-CM

## 2018-10-24 DIAGNOSIS — J029 Acute pharyngitis, unspecified: Secondary | ICD-10-CM | POA: Diagnosis not present

## 2018-10-24 NOTE — ED Triage Notes (Signed)
Per pt she started having sore throat with fatigue that started yesterday. Her oldest child has been having a sore throat with fevers so she began yesterday. She has no fevers, no cough pain in her right ear.

## 2018-10-24 NOTE — ED Provider Notes (Signed)
[facility]    CSN: [id number] Arrival date & time: 10/24/18  1144      History   Chief Complaint Chief Complaint  Patient presents with  . Sore Throat  . Fatigue    HPI Gail George is a 33 y.o. female with history of hypothyroidism presenting for sore throat, fatigue since this morning.  States that some of her children have had intermittent sore throats for the last few days with subjective fever so she wanted to get checked.  Patient concerned about COVID.  States she is sister who is a Marine scientist in urgent care caring for patients COVID.   Past Medical History:  Diagnosis Date  . Gallstones 11/2015  . Hypothyroidism   . PCOS (polycystic ovarian syndrome)     Patient Active Problem List   Diagnosis Date Noted  . Choledocholithiasis   . Surgery, elective   . Dilated bile duct   . Choledocholithiasis with obstruction 11/20/2015  . Hypothyroidism 11/20/2015  . PCOS (polycystic ovarian syndrome) 11/20/2015  . Acute pancreatitis 11/20/2015  . Hyperglycemia 11/20/2015  . Transaminitis 11/20/2015  . Cholangitis 11/20/2015  . Hyperbilirubinemia 11/20/2015    Past Surgical History:  Procedure Laterality Date  . CHOLECYSTECTOMY N/A 11/24/2015   Procedure: LAPAROSCOPIC CHOLECYSTECTOMY WITH INTRAOPERATIVE CHOLANGIOGRAM;  Surgeon: Greer Pickerel, MD;  Location: [place];  Service: General;  Laterality: N/A;  . WISDOM TOOTH EXTRACTION      OB History    Gravida  5   Para  3   Term  3   Preterm      AB  2   Living  3     SAB  2   TAB      Ectopic      Multiple      Live Births  3            Home Medications    Prior to Admission medications   Medication Sig Start Date End Date Taking? Authorizing Provider  ibuprofen (ADVIL,MOTRIN) 800 MG tablet Take 1 tablet (800 mg total) by mouth every 8 (eight) hours as needed. 11/25/15   Kinsinger, Arta Bruce, MD  pantoprazole (PROTONIX) 40 MG tablet Take 1 tablet (40 mg total) by mouth daily. 11/26/15    Lorella Nimrod, MD  Prenatal Vit-Fe Fumarate-FA (PRENATAL MULTIVITAMIN) TABS tablet Take 1 tablet by mouth daily at 12 noon.    [provider]  thyroid (ARMOUR) 15 MG tablet Take 15 mg by mouth daily. Pt takes with 60 mg to equal 75 mg    [provider]    Family History Family History  Problem Relation Age of Onset  . Cancer Mother   . Cancer Maternal Grandfather   . Cancer Paternal Grandfather     Social History Social History   Tobacco Use  . Smoking status: Never Smoker  . Smokeless tobacco: Never Used  Substance Use Topics  . Alcohol use: No  . Drug use: No     Allergies   Patient has no known allergies.   Review of Systems Review of Systems  Constitutional: Positive for fatigue. Negative for activity change, appetite change and fever.  HENT: Positive for sore throat. Negative for congestion, dental problem, ear pain, facial swelling, hearing loss, sinus pressure, sinus pain, tinnitus, trouble swallowing and voice change.   Eyes: Negative for photophobia, pain, redness and visual disturbance.  Respiratory: Negative for cough, shortness of breath and wheezing.   Cardiovascular: Negative for chest pain and palpitations.  Gastrointestinal: Negative for  abdominal pain, diarrhea and vomiting.  Endocrine: Negative for polydipsia, polyphagia and polyuria.  Musculoskeletal: Negative for arthralgias and myalgias.  Skin: Negative for rash and wound.  Neurological: Negative for dizziness, syncope and headaches.     Physical Exam Triage Vital Signs ED Triage Vitals  Enc Vitals Group     BP 10/24/18 1211 112/77     Pulse Rate 10/24/18 1211 100     Resp 10/24/18 1211 16     Temp 10/24/18 1211 98.3 F (36.8 C)     Temp Source 10/24/18 1211 Oral     SpO2 10/24/18 1211 97 %     Weight --      Height --      Head Circumference --      Peak Flow --      Pain Score 10/24/18 1212 2     Pain Loc --      Pain Edu? --      Excl. in GC? --    No data  found.  Updated Vital Signs BP 112/77 (BP Location: Right Arm)   Pulse 100   Temp 98.3 F (36.8 C) (Oral)   Resp 16   SpO2 97%   Visual Acuity Right Eye Distance:   Left Eye Distance:   Bilateral Distance:    Right Eye Near:   Left Eye Near:    Bilateral Near:     Physical Exam Constitutional:      General: She is not in acute distress.    Appearance: She is well-developed. She is not ill-appearing.  HENT:     Head: Normocephalic and atraumatic.     Jaw: There is normal jaw occlusion. No tenderness or pain on movement.     Right Ear: Hearing, tympanic membrane, ear canal and external ear normal. No tenderness. No mastoid tenderness.     Left Ear: Hearing, tympanic membrane, ear canal and external ear normal. No tenderness. No mastoid tenderness.     Nose: No nasal deformity, septal deviation, nasal tenderness, congestion or rhinorrhea.     Right Turbinates: Not swollen or pale.     Left Turbinates: Not swollen or pale.     Right Sinus: No maxillary sinus tenderness or frontal sinus tenderness.     Left Sinus: No maxillary sinus tenderness or frontal sinus tenderness.     Mouth/Throat:     Lips: Pink. No lesions.     Mouth: Mucous membranes are moist. No injury.     Pharynx: Oropharynx is clear. Uvula midline. No posterior oropharyngeal erythema or uvula swelling.     Comments: no tonsillar exudate or hypertrophy Eyes:     General: No scleral icterus.    Pupils: Pupils are equal, round, and reactive to light.  Neck:     Musculoskeletal: Normal range of motion and neck supple. No muscular tenderness.  Cardiovascular:     Rate and Rhythm: Normal rate and regular rhythm.     Heart sounds: No murmur. No gallop.   Pulmonary:     Effort: Pulmonary effort is normal. No respiratory distress.     Breath sounds: No wheezing.  Lymphadenopathy:     Cervical: No cervical adenopathy.  Skin:    Capillary Refill: Capillary refill takes less than 2 seconds.     Coloration: Skin is  not jaundiced or pale.  Neurological:     General: No focal deficit present.     Mental Status: She is alert and oriented to person, place, and time.  UC Treatments / Results  Labs (all labs ordered are listed, but only abnormal results are displayed) Labs Reviewed  NOVEL CORONAVIRUS, NAA    EKG   Radiology No results found.  Procedures Procedures (including critical care time)  Medications Ordered in UC Medications - No data to display  Initial Impression / Assessment and Plan / UC Course  I have reviewed the triage vital signs and the nursing notes.  Pertinent labs & imaging results that were available during my care of the patient were reviewed by me and considered in my medical decision making (see chart for details).     1.  Sore throat Patient afebrile, does not meet criteria for rapid strep in office.  Will treat conservatively as listed below.  Covid test pending.  Patient able to isolate at home until her results are back. Final Clinical Impressions(s) / UC Diagnoses   Final diagnoses:  Sore throat  Encounter for screening for other viral diseases     Discharge Instructions     Your COVID test is pending: Is important you quarantine at home until your results are back. You may take OTC Tylenol for fever and myalgias.  It is important to drink plenty of water throughout the day to stay hydrated. If you test positive and later develop severe fever, cough, or shortness of breath, it is recommended that you go to the ER for further evaluation.   ED Prescriptions    None     Controlled Substance Prescriptions [place] Controlled Substance Registry consulted? Not Applicable   Shea EvansHall-Potvin, Brittany, [place]-C 10/24/18 1251

## 2018-10-24 NOTE — Discharge Instructions (Signed)
Your COVID test is pending: Is important you quarantine at home until your results are back. °You may take OTC Tylenol for fever and myalgias.  It is important to drink plenty of water throughout the day to stay hydrated. °If you test positive and later develop severe fever, cough, or shortness of breath, it is recommended that you go to the ER for further evaluation. °

## 2018-10-25 ENCOUNTER — Telehealth: Payer: Self-pay | Admitting: Emergency Medicine

## 2018-10-25 NOTE — Telephone Encounter (Signed)
Checked in on patient, discussed medications, and encouraged return call with any continuing questions or concerns.    

## 2018-10-26 LAB — NOVEL CORONAVIRUS, NAA: SARS-CoV-2, NAA: NOT DETECTED

## 2018-10-27 ENCOUNTER — Encounter (HOSPITAL_COMMUNITY): Payer: Self-pay

## 2019-03-25 ENCOUNTER — Ambulatory Visit
Admission: EM | Admit: 2019-03-25 | Discharge: 2019-03-25 | Disposition: A | Discharge status: Home / Self Care | Attending: Emergency Medicine | Admitting: Emergency Medicine

## 2019-03-25 ENCOUNTER — Encounter: Payer: Self-pay | Admitting: Emergency Medicine

## 2019-03-25 ENCOUNTER — Other Ambulatory Visit: Payer: Self-pay

## 2019-03-25 DIAGNOSIS — R509 Fever, unspecified: Secondary | ICD-10-CM

## 2019-03-25 DIAGNOSIS — R05 Cough: Secondary | ICD-10-CM

## 2019-03-25 DIAGNOSIS — R000 Tachycardia, unspecified: Secondary | ICD-10-CM

## 2019-03-25 DIAGNOSIS — R059 Cough, unspecified: Secondary | ICD-10-CM

## 2019-03-25 DIAGNOSIS — M791 Myalgia, unspecified site: Secondary | ICD-10-CM

## 2019-03-25 DIAGNOSIS — Z20822 Contact with and (suspected) exposure to covid-19: Secondary | ICD-10-CM

## 2019-03-25 LAB — POC SARS CORONAVIRUS 2 AG -  ED: SARS Coronavirus 2 Ag: NEGATIVE

## 2019-03-25 MED ORDER — BENZONATATE 100 MG PO CAPS: 100.0000 mg | ORAL_CAPSULE | Freq: Three times a day (TID) | ORAL | 0 refills | Status: AC

## 2019-03-25 MED ORDER — AEROCHAMBER PLUS FLO-VU MEDIUM MISC: 1.0000 | Freq: Once | 0 refills | Status: AC

## 2019-03-25 MED ORDER — LORATADINE 10 MG PO TABS: 10.0000 mg | ORAL_TABLET | Freq: Every day | ORAL | 0 refills | Status: AC

## 2019-03-25 NOTE — ED Triage Notes (Signed)
Pt here for cough, fever and body aches starting today

## 2019-03-25 NOTE — ED Provider Notes (Addendum)
[facility]    CSN: [id number] Arrival date & time: 03/25/19  1807      History   Chief Complaint Chief Complaint  Patient presents with  . Fever  . Cough    HPI Gail George is a 34 y.o. female with history of PCOS, hypothyroidism presenting for acute onset of fever, cough, myalgias since today.  Denies known sick contacts, Covid contacts.  Unknown T-max at home.  States cough is dry, without hemoptysis.  Has had some increased difficulty breathing, though denies shortness of breath, palpitations, chest pain.  Patient notes baseline of tachycardia without significant previous work-up.  The, vomiting, diarrhea, change in urinary habit.  Has not taken anything for symptoms yet.  Have an inhaler at home for history of exercise-induced asthma: Has not yet tried this.  Denies history of DVT/PE, maculopathy, OCP or tobacco use.  No lower extremity edema, claudication.   Past Medical History:  Diagnosis Date  . Gallstones 11/2015  . Hypothyroidism   . PCOS (polycystic ovarian syndrome)     Patient Active Problem List   Diagnosis Date Noted  . Choledocholithiasis   . Surgery, elective   . Dilated bile duct   . Choledocholithiasis with obstruction 11/20/2015  . Hypothyroidism 11/20/2015  . PCOS (polycystic ovarian syndrome) 11/20/2015  . Acute pancreatitis 11/20/2015  . Hyperglycemia 11/20/2015  . Transaminitis 11/20/2015  . Cholangitis 11/20/2015  . Hyperbilirubinemia 11/20/2015    Past Surgical History:  Procedure Laterality Date  . CHOLECYSTECTOMY N/A 11/24/2015   Procedure: LAPAROSCOPIC CHOLECYSTECTOMY WITH INTRAOPERATIVE CHOLANGIOGRAM;  Surgeon: Gaynelle Adu, MD;  Location: [facility] OR;  Service: General;  Laterality: N/A;  . WISDOM TOOTH EXTRACTION      OB History    Gravida  5   Para  3   Term  3   Preterm      AB  2   Living  3     SAB  2   TAB      Ectopic      Multiple      Live Births  3            Home Medications    Prior  to Admission medications   Medication Sig Start Date End Date Taking? Authorizing Provider  benzonatate (TESSALON) 100 MG capsule Take 1 capsule (100 mg total) by mouth every 8 (eight) hours. 03/25/19   Hall-Potvin, [place], PA-C  ibuprofen (ADVIL,MOTRIN) 800 MG tablet Take 1 tablet (800 mg total) by mouth every 8 (eight) hours as needed. 11/25/15   Kinsinger, De Blanch, MD  loratadine (CLARITIN) 10 MG tablet Take 1 tablet (10 mg total) by mouth daily. 03/25/19   Hall-Potvin, [place], PA-C  pantoprazole (PROTONIX) 40 MG tablet Take 1 tablet (40 mg total) by mouth daily. 11/26/15   Arnetha Courser, MD  Prenatal Vit-Fe Fumarate-FA (PRENATAL MULTIVITAMIN) TABS tablet Take 1 tablet by mouth daily at 12 noon.    [provider]  Spacer/Aero-Holding Chambers (AEROCHAMBER PLUS FLO-VU MEDIUM) MISC 1 each by Other route once for 1 dose. 03/25/19 03/25/19  Hall-Potvin, [place], PA-C  thyroid (ARMOUR) 15 MG tablet Take 15 mg by mouth daily. Pt takes with 60 mg to equal 75 mg    [provider]    Family History Family History  Problem Relation Age of Onset  . Cancer Mother   . Cancer Maternal Grandfather   . Cancer Paternal Grandfather     Social History Social History   Tobacco Use  . Smoking status: Never  Smoker  . Smokeless tobacco: Never Used  Substance Use Topics  . Alcohol use: No  . Drug use: No     Allergies   Patient has no known allergies.   Review of Systems As per HPI   Physical Exam Triage Vital Signs ED Triage Vitals  Enc Vitals Group     BP      Pulse      Resp      Temp      Temp src      SpO2      Weight      Height      Head Circumference      Peak Flow      Pain Score      Pain Loc      Pain Edu?      Excl. in [place]?    No data found.  Updated Vital Signs BP (!) 142/82 (BP Location: Left Arm)   Pulse (!) 122   Temp (!) 100.9 F (38.3 C) (Oral)   Resp 18   SpO2 96%   Visual Acuity Right Eye Distance:   Left Eye Distance:     Bilateral Distance:    Right Eye Near:   Left Eye Near:    Bilateral Near:     Physical Exam Constitutional:      General: She is not in acute distress.    Appearance: She is obese. She is not ill-appearing or diaphoretic.  HENT:     Head: Normocephalic and atraumatic.     Mouth/Throat:     Mouth: Mucous membranes are moist.     Pharynx: Oropharynx is clear. No oropharyngeal exudate or posterior oropharyngeal erythema.  Eyes:     General: No scleral icterus.    Conjunctiva/sclera: Conjunctivae normal.     Pupils: Pupils are equal, round, and reactive to light.  Neck:     Comments: Trachea midline, negative JVD Cardiovascular:     Rate and Rhythm: Regular rhythm. Tachycardia present.     Heart sounds: No murmur. No friction rub. No gallop.   Pulmonary:     Effort: Pulmonary effort is normal. No respiratory distress.     Breath sounds: No wheezing, rhonchi or rales.  Musculoskeletal:     Cervical back: Neck supple. No tenderness.     Right lower leg: No edema.     Left lower leg: No edema.  Lymphadenopathy:     Cervical: No cervical adenopathy.  Skin:    Capillary Refill: Capillary refill takes less than 2 seconds.     Coloration: Skin is not jaundiced or pale.     Findings: No rash.  Neurological:     General: No focal deficit present.     Mental Status: She is alert and oriented to person, place, and time.      UC Treatments / Results  Labs (all labs ordered are listed, but only abnormal results are displayed) Labs Reviewed  POC SARS CORONAVIRUS 2 AG -  ED - Normal  NOVEL CORONAVIRUS, NAA    EKG   Radiology No results found.  Procedures Procedures (including critical care time)  Medications Ordered in UC Medications - No data to display  Initial Impression / Assessment and Plan / UC Course  I have reviewed the triage vital signs and the nursing notes.  Pertinent labs & imaging results that were available during my care of the patient were reviewed  by me and considered in my medical decision making (see chart for details).  Patient febrile, tachycardic in office today: Rapid Covid negative.  PCR pending.  Denying chest pain or shortness of breath in office today.  Will treat supportively as outlined below.  Return precautions discussed, patient verbalized understanding and is agreeable to plan. Final Clinical Impressions(s) / UC Diagnoses   Final diagnoses:  Fever, unspecified fever cause  Cough  Myalgia     Discharge Instructions     Tessalon for cough. Start flonase, atrovent nasal spray for nasal congestion/drainage. You can use over the counter nasal saline rinse such as neti pot for nasal congestion. Keep hydrated, your urine should be clear to pale yellow in color. Tylenol/motrin for fever and pain. Monitor for any worsening of symptoms, chest pain, shortness of breath, wheezing, swelling of the throat, go to the emergency department for further evaluation needed.     ED Prescriptions    Medication Sig Dispense Auth. Provider   Spacer/Aero-Holding Chambers (AEROCHAMBER PLUS FLO-VU MEDIUM) MISC 1 each by Other route once for 1 dose. 1 each Hall-Potvin, [place], PA-C   loratadine (CLARITIN) 10 MG tablet Take 1 tablet (10 mg total) by mouth daily. 30 tablet Hall-Potvin, [place], PA-C   benzonatate (TESSALON) 100 MG capsule Take 1 capsule (100 mg total) by mouth every 8 (eight) hours. 21 capsule Hall-Potvin, [place], PA-C     PDMP not reviewed this encounter.   Hall-Potvin, [place], PA-C 03/25/19 1852    Odette Fraction [place], [place] 03/25/19 1853

## 2019-03-25 NOTE — Discharge Instructions (Signed)

## 2019-03-27 LAB — NOVEL CORONAVIRUS, NAA: SARS-CoV-2, NAA: NOT DETECTED

## 2019-05-25 ENCOUNTER — Other Ambulatory Visit: Payer: Self-pay | Admitting: Obstetrics and Gynecology

## 2021-03-07 ENCOUNTER — Encounter: Payer: Self-pay | Admitting: Neurology

## 2021-03-07 ENCOUNTER — Ambulatory Visit: Payer: Self-pay | Admitting: Neurology

## 2021-03-07 VITALS — BP 120/79 | HR 99 | Ht 69.0 in | Wt 258.0 lb

## 2021-03-07 DIAGNOSIS — R519 Headache, unspecified: Secondary | ICD-10-CM

## 2021-03-07 DIAGNOSIS — H5712 Ocular pain, left eye: Secondary | ICD-10-CM

## 2021-03-07 DIAGNOSIS — H4710 Unspecified papilledema: Secondary | ICD-10-CM

## 2021-03-07 DIAGNOSIS — E669 Obesity, unspecified: Secondary | ICD-10-CM

## 2021-03-07 NOTE — Patient Instructions (Signed)
It was nice to meet you today.   Your eye doctor detected swelling of both eye nerves on your eye examination.   You may have a condition called pseudotumor cerebri, which means that there increased fluid pressure around your brain and results in pressure on your brain, which can cause headache, and pressure on the eye nerve(s), which can cause blurry vision, even loss of vision. Here is what we discussed today: 1. We will do a brain MRI as well as an MR venogram of the brain (to look at the blood vessel drainage system). 2. We will request a LP (lumbar puncture/spinal tap) with pressure testing and routine fluid testing. We will call you with the results.  3.  Please continue to follow-up with your eye doctor on regular basis. 4.  If your spinal fluid pressure is indeed elevated, we will ask you to start a medication called diamox to help keep your spinal fluid pressure at bay.  Some people need more than 1 spinal tap over time. 5. As you know, your vision and visual field can be affected. The most serious complication of having pseudotumor cerebri is loss of vision, which can be permanent. Work up, at least initially with the eye specialist should include: best corrected visual acuity, formal visual field testing, dilated fundus examination with optic disc photographs, and often optical coherence tomography (OCT) of the optic nerve, retinal nerve fiber layer, and macular ganglion cell layer. Worsening vision is an indication for intensifying treatment.  6.  Ultimately, you may need to be considered for a shunt in the future (to prevent fluid pressure from building up over and over). 7. We will also do a sleep study to rule out obstructive sleep apnea.  If you have obstructive sleep apnea, I will want you to start treatment with a machine called CPAP or AutoPAP.  Treatment of obstructive sleep apnea can help headaches, also help with metabolism and weight loss, and ultimately, treatment of sleep apnea can  reduce risk for cardiovascular complications including heart disease and stroke risk. We will keep you posted as to your test results by phone call for now.  Please follow-up in this clinic in about 3 months, sooner if needed.

## 2021-03-07 NOTE — Progress Notes (Signed)
Subjective:    Patient ID: Gail George is a 36 y.o. female.  HPI    Huston FoleySaima Annalyse Langlais, MD, PhD [facility] [address], [address] [address] [place], [place] [address]  Dear Dr. Lorin PicketScott,   I saw your patient, Gail George, upon your kind request, in my neurologic clinic today for initial consultation of her papilledema and concern for underlying IIH (Idiopathic intracranial hypertension). The patient is unaccompanied today.  As you know, Gail George is a 36 year old Right-handed woman with an underlying medical history of Cholelithiasis, hypothyroidism, PCOS, and obesity, who reports recurrent headaches for the past 1 to 2 years.  She does not have a history of migraines, these headaches have been in the back of her head or in the bitemporal area, she has had left eye pain as well intermittently for the past 1 to 2 years and attributed her eye pain and actually to difficulty with her contact lenses.  She does not have any associated nausea or vomiting, no symptom onset of one-sided weakness or numbness or tingling or droopy face or slurring of speech.  She has had intermittent paresthesias.  She has recently had a miscarriage.  Her weight has been more or less stable since she had children.  She has 3 children, ages 427, 5 and 3.   I reviewed your office note from 12/28/2021.  She was noted to have some elevated optic nerves bilaterally, no frank swelling, overall good corrected visual acuity and no visual field defects.  She sleeps fairly well, denies any daytime somnolence.  She has never had a sleep study.  She has never had a brain MRI.  Her Past Medical History Is Significant For: Past Medical History:  Diagnosis Date   Gallstones 11/2015   Hypothyroidism    PCOS (polycystic ovarian syndrome)     Her Past Surgical History Is Significant For: Past Surgical History:  Procedure Laterality Date   CHOLECYSTECTOMY N/A 11/24/2015   Procedure: LAPAROSCOPIC CHOLECYSTECTOMY  WITH INTRAOPERATIVE CHOLANGIOGRAM;  Surgeon: Gaynelle AduEric Wilson, MD;  [facility];  Service: General;  Laterality: N/A;   WISDOM TOOTH EXTRACTION      Her Family History Is Significant For: Family History  Problem Relation Age of Onset   Cancer Mother    Cancer Maternal Grandfather    Cancer Paternal Grandfather     Her Social History Is Significant For: Social History   Socioeconomic History   Marital status: Married    Spouse name: Not on file   Number of children: Not on file   Years of education: Not on file   Highest education level: Not on file  Occupational History   Not on file  Tobacco Use   Smoking status: Never   Smokeless tobacco: Never  Vaping Use   Vaping Use: Never used  Substance and Sexual Activity   Alcohol use: No   Drug use: No   Sexual activity: Yes    Birth control/protection: None  Other Topics Concern   Not on file  Social History Narrative   Not on file   Social Determinants of Health   Financial Resource Strain: Not on file  Food Insecurity: Not on file  Transportation Needs: Not on file  Physical Activity: Not on file  Stress: Not on file  Social Connections: Not on file    Her Allergies Are:  No Known Allergies:   Her Current Medications Are:  Outpatient Encounter Medications as of 03/07/2021  Medication Sig   aspirin (BAYER LOW DOSE)  81 MG chewable tablet Chew 81 mg by mouth daily.   ibuprofen (ADVIL,MOTRIN) 800 MG tablet Take 1 tablet (800 mg total) by mouth every 8 (eight) hours as needed.   Prenatal Vit-Fe Fumarate-FA (PRENATAL MULTIVITAMIN) TABS tablet Take 1 tablet by mouth daily at 12 noon.   thyroid (ARMOUR) 15 MG tablet Take 120 mg by mouth daily. Pt takes with 60 mg to equal 75 mg   benzonatate (TESSALON) 100 MG capsule Take 1 capsule (100 mg total) by mouth every 8 (eight) hours.   loratadine (CLARITIN) 10 MG tablet Take 1 tablet (10 mg total) by mouth daily.   pantoprazole (PROTONIX) 40 MG tablet Take 1 tablet (40 mg  total) by mouth daily.   No facility-administered encounter medications on file as of 03/07/2021.  :   Review of Systems:  Out of a complete 14 point review of systems, all are reviewed and negative with the exception of these symptoms as listed below:  Review of Systems  Neurological:        Pt  is here intercranial  Hypertension Pt states she does have headaches everyday and left eye pain . Pt states headaches have increased in the last year .   ESS:1 FSS:15   Objective:  Neurological Exam  Physical Exam Physical Examination:   Vitals:   03/07/21 1454  BP: 120/79  Pulse: 99    General Examination: The patient is a very pleasant 36 y.o. female in no acute distress. She appears well-developed and well-nourished and well groomed.   HEENT: Normocephalic, atraumatic, pupils are equal, round and reactive to light, extraocular tracking is good without limitation to gaze excursion or nystagmus noted.  Funduscopic exam shows not quite sharp disc margins but I am not sure I can see frank papilledema.  Hearing is grossly intact, tympanic membranes clear bilaterally.  Face is symmetric with normal facial animation and normal facial sensation to light touch, temperature and vibration. Speech is clear with no dysarthria noted. There is no hypophonia. There is no lip, neck/head, jaw or voice tremor. Neck is supple with full range of passive and active motion. There are no carotid bruits on auscultation. Oropharynx exam reveals: mild mouth dryness, adequate dental hygiene and mild airway crowding, due to smaller airway entry. Tongue protrudes centrally and palate elevates symmetrically.   Chest: Clear to auscultation without wheezing, rhonchi or crackles noted.  Heart: S1+S2+0, regular and normal without murmurs, rubs or gallops noted.   Abdomen: Soft, non-tender and non-distended with normal bowel sounds appreciated on auscultation.  Extremities: There is no pitting edema in the distal lower  extremities bilaterally.   Skin: Warm and dry without trophic changes noted.   Musculoskeletal: exam reveals no obvious joint deformities, tenderness or joint swelling or erythema.   Neurologically:  Mental status: The patient is awake, alert and oriented in all 4 spheres. Her immediate and remote memory, attention, language skills and fund of knowledge are appropriate. There is no evidence of aphasia, agnosia, apraxia or anomia. Speech is clear with normal prosody and enunciation. Thought process is linear. Mood is normal and affect is normal.  Cranial nerves II - XII are as described above under HEENT exam.  Motor exam: Normal bulk, strength and tone is noted. There is no tremor, Romberg is negative. Reflexes are 2+ throughout, toes are downgoing bilaterally.. Fine motor skills and coordination: grossly intact.  In particular, normal finger taps, foot taps, normal hand movements, normal rapid alternating patting.  Cerebellar testing: No dysmetria or intention tremor.  There is no truncal or gait ataxia.  Normal finger-to-nose and heel-to-shin bilaterally. Sensory exam: intact to light touch, temperature and vibration sense in the upper and lower extremities.  Gait, station and balance: She stands easily. No veering to one side is noted. No leaning to one side is noted. Posture is age-appropriate and stance is narrow based. Gait shows normal stride length and normal pace. No problems turning are noted. Tandem walk is unremarkable.                Assessment and Plan:    In summary, Amiyah Shryock is a very pleasant 36 y.o.-year old female with an underlying medical history of Cholelithiasis, hypothyroidism, PCOS, and obesity, who presents for evaluation of her papilledema and recurrent headaches, concern for underlying pseudotumor cerebri.  I had a long chat with the patient about her symptoms, papilledema, and the possibility of idiopathic intracranial hypertension as an underlying condition.  We  talked about possible causes and correlations.  I would like for her to proceed with additional evaluation with a brain MRI, MR venogram of the head, lumbar puncture with opening pressure and closing pressure, as well as sleep testing to rule out underlying obstructive sleep apnea.  We talked about the importance of weight management.  She is being followed by her OB for recent miscarriage.   We talked about potentially utilizing medications to keep pressure at bay.  She is advised to proceed with a lumbar puncture and I explained this procedure to her.  If she has obstructive sleep apnea, I would like for her to consider positive airway pressure treatment.  For now, we will try to focus on pursuing lumbar puncture and brain scans.  We will keep her posted as to her test results by phone call and plan to follow-up in this clinic accordingly, I may ask her to start Diamox in the near future.  She is advised to follow-up with your clinic on a regular basis to make sure her eye examination stays stable, her papilledema improves, and that she does not develop any additional problems such as visual field defect.  She was provided with detailed written instructions.  I placed an order for a fluoroscopic guided brain with and without contrast as well as MR venogram of the head.  I would like to proceed with a chemistry panel as far as blood work today to make sure her kidney function is okay.  I answered all her questions today and she was in agreement with our plan.  Thank you very much for allowing me to participate in the care of this nice patient. If I can be of any further assistance to you please do not hesitate to call me at [phone].  Sincerely,   Huston Foley, MD, PhD

## 2021-03-08 ENCOUNTER — Telehealth: Payer: Self-pay | Admitting: Neurology

## 2021-03-08 LAB — COMPREHENSIVE METABOLIC PANEL
ALT: 16 IU/L (ref 0–32)
AST: 16 IU/L (ref 0–40)
Albumin/Globulin Ratio: 1.6 (ref 1.2–2.2)
Albumin: 4.6 g/dL (ref 3.8–4.8)
Alkaline Phosphatase: 69 IU/L (ref 44–121)
BUN/Creatinine Ratio: 20 (ref 9–23)
BUN: 13 mg/dL (ref 6–20)
Bilirubin Total: 0.5 mg/dL (ref 0.0–1.2)
CO2: 23 mmol/L (ref 20–29)
Calcium: 9.8 mg/dL (ref 8.7–10.2)
Chloride: 102 mmol/L (ref 96–106)
Creatinine, Ser: 0.64 mg/dL (ref 0.57–1.00)
Globulin, Total: 2.8 g/dL (ref 1.5–4.5)
Glucose: 96 mg/dL (ref 70–99)
Potassium: 4.4 mmol/L (ref 3.5–5.2)
Sodium: 140 mmol/L (ref 134–144)
Total Protein: 7.4 g/dL (ref 6.0–8.5)
eGFR: 120 mL/min/{1.73_m2} (ref >59)

## 2021-03-08 NOTE — Telephone Encounter (Signed)
[facility]ricare order sent to GI, [facility] they will reach out to the patient to schedule.

## 2021-03-12 ENCOUNTER — Telehealth: Payer: Self-pay

## 2021-03-12 NOTE — Telephone Encounter (Signed)
Called pt to schedule her HST however, pt states she would like to hold off on getting the study done. Pt was advised to call back when she is ready to schedule.

## 2021-03-24 ENCOUNTER — Other Ambulatory Visit: Payer: Self-pay

## 2021-03-24 ENCOUNTER — Ambulatory Visit
Admission: RE | Admit: 2021-03-24 | Discharge: 2021-03-24 | Disposition: A | Discharge status: Home / Self Care | Source: Ambulatory Visit | Attending: Neurology | Admitting: Neurology

## 2021-03-24 ENCOUNTER — Ambulatory Visit
Admission: RE | Admit: 2021-03-24 | Discharge: 2021-03-24 | Disposition: A | Discharge status: Home / Self Care | Payer: No Typology Code available for payment source | Source: Ambulatory Visit | Attending: Neurology | Admitting: Neurology

## 2021-03-24 DIAGNOSIS — H4710 Unspecified papilledema: Secondary | ICD-10-CM

## 2021-03-24 DIAGNOSIS — R519 Headache, unspecified: Secondary | ICD-10-CM

## 2021-03-24 DIAGNOSIS — E669 Obesity, unspecified: Secondary | ICD-10-CM

## 2021-03-24 DIAGNOSIS — H5712 Ocular pain, left eye: Secondary | ICD-10-CM

## 2021-03-24 MED ORDER — GADOBENATE DIMEGLUMINE 529 MG/ML IV SOLN
20.0000 mL | Freq: Once | INTRAVENOUS | Status: AC | PRN
  Administered 2021-03-24: 20 mL via INTRAVENOUS

## 2021-03-25 NOTE — Progress Notes (Signed)
Please call and advise the patient that the recent head MRV did not show any clots. The right transverse sinus is smaller than the left, even thought this is non specific finding, it can be seen in patient with idiopathic intracranial hypertension.  Dr. Frances Furbish will contact you after completion of all tests to discuss further treatment is needed.   Gail Norfolk, MD

## 2021-03-28 ENCOUNTER — Telehealth: Payer: Self-pay | Admitting: *Deleted

## 2021-03-28 NOTE — Telephone Encounter (Signed)
I called pt and relayed her MRV results as per below, she verbalized understanding.  Has LP scheduled tomorrow.

## 2021-03-28 NOTE — Telephone Encounter (Signed)
-----   Message from Alric Ran, MD sent at 03/25/2021  2:34 PM EST ----- Please call and advise the patient that the recent head MRV did not show any clots. The right transverse sinus is smaller than the left, even thought this is non specific finding, it can be seen in patient with idiopathic intracranial hypertension.  Dr. Rexene Alberts will contact you after completion of all tests to discuss further treatment is needed.   Alric Ran, MD

## 2021-03-29 ENCOUNTER — Ambulatory Visit
Admission: RE | Admit: 2021-03-29 | Discharge: 2021-03-29 | Disposition: A | Discharge status: Home / Self Care | Payer: Self-pay | Source: Ambulatory Visit | Attending: Neurology | Admitting: Neurology

## 2021-03-29 ENCOUNTER — Telehealth: Payer: Self-pay | Admitting: Neurology

## 2021-03-29 ENCOUNTER — Other Ambulatory Visit: Payer: Self-pay

## 2021-03-29 DIAGNOSIS — R519 Headache, unspecified: Secondary | ICD-10-CM

## 2021-03-29 DIAGNOSIS — E669 Obesity, unspecified: Secondary | ICD-10-CM

## 2021-03-29 DIAGNOSIS — H4710 Unspecified papilledema: Secondary | ICD-10-CM

## 2021-03-29 DIAGNOSIS — H5712 Ocular pain, left eye: Secondary | ICD-10-CM

## 2021-03-29 MED ORDER — ACETAZOLAMIDE ER 500 MG PO CP12: 500.0000 mg | ORAL_CAPSULE | Freq: Two times a day (BID) | ORAL | 5 refills | Status: AC

## 2021-03-29 NOTE — Discharge Instructions (Signed)

## 2021-03-29 NOTE — Telephone Encounter (Signed)
See result note for LP.

## 2021-04-01 NOTE — Telephone Encounter (Signed)
Huston Foley, MD  03/29/2021  1:09 PM EST     Please call patient and advise her that her recent lumbar puncture showed indeed an elevated fluid pressure, in keeping with IIH (idiopathic intracranial hypertension), as discussed, I would like for her to start a medication at this point:    Diamox (generic name: acetazolamide) 500 mg strength: Take one pill each night at bedtime for 1 week, then 1 pill twice daily thereafter. Common side effects reported are: Dizziness, lightheadedness, increase in urine output, blurry vision, dry mouth, drowsiness, loss of appetite, upset stomach, headache and tiredness, tingling in the hands and feet and change in taste, especially with carbonated sodas. Most side effects are transient especially during the first few days as the body adjusts to the medication.     Rx will be sent to her pharmacy on file.

## 2021-04-01 NOTE — Telephone Encounter (Signed)
Spoke with the patient and discussed her lumbar puncture results and Dr. Teofilo Pod plan for patient to start Diamox.  We discussed the Diamox, instructions, potential side effects.  Patient did mention that there is a possibility that she might be pregnant.  She states they have been trying to get pregnant.  She wanted to know if it was still okay to take the Diamox.  I told her I would discuss this with Dr. Frances Furbish and call her back.  Regarding post-procedure recovery, patient noticed last night that she had sudden pain in the back of her head and neck.  She took Motrin and it got better with laying down.  She slept.  Today she is better, just a dull headache, has been up, sitting mostly.  Has not noticed anything yet.  Trying to make sure she drinks plenty of water.  She will call if she has any further issues with headaches. We will call back with recommendation on Diamox and pregnancy.

## 2021-04-01 NOTE — Telephone Encounter (Signed)
Please have the patient hold off on the Diamox until she has confirmation about her pregnancy.  She will need to check with her OB if she is safe to take Diamox during pregnancy.

## 2021-04-02 LAB — CSF CELL COUNT WITH DIFFERENTIAL
RBC Count, CSF: 0 cells/uL
WBC, CSF: 0 cells/uL (ref 0–5)

## 2021-04-02 LAB — PROTEIN, CSF: Total Protein, CSF: 32 mg/dL (ref 15–45)

## 2021-04-02 LAB — CSF CULTURE W GRAM STAIN
GRAM STAIN:: NONE SEEN
MICRO NUMBER:: 13024098
Result:: NO GROWTH
SPECIMEN QUALITY:: ADEQUATE

## 2021-04-02 LAB — GLUCOSE, CSF: Glucose, CSF: 51 mg/dL (ref 40–80)
